# Patient Record
Sex: Male | Born: 1975 | Race: White | Hispanic: No | Marital: Married | Smoking: Former smoker
Health system: Southern US, Community
[De-identification: ages and names within clinical notes are randomized; demographics above are authoritative.]

## PROBLEM LIST (undated history)

## (undated) DIAGNOSIS — R002 Palpitations: Secondary | ICD-10-CM

## (undated) DIAGNOSIS — G4700 Insomnia, unspecified: Secondary | ICD-10-CM

## (undated) DIAGNOSIS — K760 Fatty (change of) liver, not elsewhere classified: Secondary | ICD-10-CM

## (undated) DIAGNOSIS — R079 Chest pain, unspecified: Secondary | ICD-10-CM

## (undated) DIAGNOSIS — I700 Atherosclerosis of aorta: Secondary | ICD-10-CM

## (undated) DIAGNOSIS — I2510 Atherosclerotic heart disease of native coronary artery without angina pectoris: Secondary | ICD-10-CM

## (undated) DIAGNOSIS — F419 Anxiety disorder, unspecified: Secondary | ICD-10-CM

## (undated) DIAGNOSIS — E538 Deficiency of other specified B group vitamins: Secondary | ICD-10-CM

## (undated) DIAGNOSIS — R7989 Other specified abnormal findings of blood chemistry: Secondary | ICD-10-CM

## (undated) DIAGNOSIS — K219 Gastro-esophageal reflux disease without esophagitis: Secondary | ICD-10-CM

## (undated) DIAGNOSIS — T7840XA Allergy, unspecified, initial encounter: Secondary | ICD-10-CM

## (undated) DIAGNOSIS — Z9289 Personal history of other medical treatment: Secondary | ICD-10-CM

## (undated) DIAGNOSIS — G4730 Sleep apnea, unspecified: Secondary | ICD-10-CM

## (undated) DIAGNOSIS — E559 Vitamin D deficiency, unspecified: Secondary | ICD-10-CM

## (undated) HISTORY — DX: Atherosclerotic heart disease of native coronary artery without angina pectoris: I25.10

## (undated) HISTORY — PX: APPENDECTOMY: SHX54

## (undated) HISTORY — DX: Morbid (severe) obesity due to excess calories: E66.01

## (undated) HISTORY — DX: Insomnia, unspecified: G47.00

## (undated) HISTORY — DX: Anxiety disorder, unspecified: F41.9

## (undated) HISTORY — PX: SURGERY SCROTAL / TESTICULAR: SUR1316

## (undated) HISTORY — DX: Vitamin D deficiency, unspecified: E55.9

## (undated) HISTORY — DX: Gastro-esophageal reflux disease without esophagitis: K21.9

## (undated) HISTORY — DX: Palpitations: R00.2

## (undated) HISTORY — DX: Deficiency of other specified B group vitamins: E53.8

## (undated) HISTORY — DX: Fatty (change of) liver, not elsewhere classified: K76.0

## (undated) HISTORY — DX: Other specified abnormal findings of blood chemistry: R79.89

## (undated) HISTORY — DX: Personal history of other medical treatment: Z92.89

## (undated) HISTORY — DX: Atherosclerosis of aorta: I70.0

## (undated) HISTORY — DX: Chest pain, unspecified: R07.9

## (undated) HISTORY — DX: Allergy, unspecified, initial encounter: T78.40XA

## (undated) HISTORY — PX: TONSILLECTOMY: SUR1361

---

## 2005-09-29 ENCOUNTER — Emergency Department: Payer: Self-pay | Admitting: Emergency Medicine

## 2013-01-11 ENCOUNTER — Emergency Department: Payer: Self-pay | Admitting: Emergency Medicine

## 2013-01-11 LAB — COMPREHENSIVE METABOLIC PANEL
Albumin: 3.8 g/dL (ref 3.4–5.0)
Alkaline Phosphatase: 64 U/L (ref 50–136)
Anion Gap: 6 — ABNORMAL LOW (ref 7–16)
BUN: 10 mg/dL (ref 7–18)
Bilirubin,Total: 0.3 mg/dL (ref 0.2–1.0)
Chloride: 111 mmol/L — ABNORMAL HIGH (ref 98–107)
Creatinine: 1 mg/dL (ref 0.60–1.30)
EGFR (African American): >60
EGFR (Non-African Amer.): >60
Potassium: 4 mmol/L (ref 3.5–5.1)
SGOT(AST): 58 U/L — ABNORMAL HIGH (ref 15–37)
SGPT (ALT): 91 U/L — ABNORMAL HIGH (ref 12–78)

## 2013-01-11 LAB — CBC
HGB: 14.5 g/dL (ref 13.0–18.0)
MCHC: 33.8 g/dL (ref 32.0–36.0)
MCV: 89 fL (ref 80–100)
Platelet: 188 10*3/uL (ref 150–440)
RBC: 4.81 10*6/uL (ref 4.40–5.90)
RDW: 13.1 % (ref 11.5–14.5)

## 2013-01-11 LAB — APTT: Activated PTT: 35.4 secs (ref 23.6–35.9)

## 2013-01-11 LAB — TROPONIN I
Troponin-I: <0.02 ng/mL
Troponin-I: <0.02 ng/mL

## 2013-01-11 LAB — CK TOTAL AND CKMB (NOT AT ARMC): CK, Total: 110 U/L (ref 35–232)

## 2013-01-14 ENCOUNTER — Encounter: Payer: Self-pay | Admitting: Cardiovascular Disease

## 2013-02-13 ENCOUNTER — Ambulatory Visit: Payer: Self-pay | Admitting: Unknown Physician Specialty

## 2014-07-25 ENCOUNTER — Ambulatory Visit: Payer: Self-pay | Admitting: Unknown Physician Specialty

## 2014-08-18 DIAGNOSIS — K219 Gastro-esophageal reflux disease without esophagitis: Secondary | ICD-10-CM | POA: Insufficient documentation

## 2014-10-09 ENCOUNTER — Ambulatory Visit: Payer: Self-pay | Admitting: Family Medicine

## 2015-05-05 ENCOUNTER — Other Ambulatory Visit
Admission: RE | Admit: 2015-05-05 | Discharge: 2015-05-05 | Disposition: A | Discharge status: Home / Self Care | Payer: Self-pay | Attending: Nurse Practitioner | Admitting: Nurse Practitioner

## 2015-05-05 DIAGNOSIS — R197 Diarrhea, unspecified: Secondary | ICD-10-CM | POA: Insufficient documentation

## 2015-05-05 LAB — C DIFFICILE QUICK SCREEN W PCR REFLEX
C Diff antigen: NEGATIVE
C Diff interpretation: NEGATIVE
C Diff toxin: NEGATIVE

## 2015-08-20 ENCOUNTER — Ambulatory Visit: Payer: Self-pay | Admitting: Family Medicine

## 2015-08-25 ENCOUNTER — Encounter: Payer: Self-pay | Admitting: Family Medicine

## 2015-08-25 ENCOUNTER — Ambulatory Visit (INDEPENDENT_AMBULATORY_CARE_PROVIDER_SITE_OTHER): Payer: PRIVATE HEALTH INSURANCE | Admitting: Family Medicine

## 2015-08-25 VITALS — BP 134/72 | HR 88 | Temp 98.5°F | Resp 19 | Ht 77.0 in | Wt 303.6 lb

## 2015-08-25 DIAGNOSIS — R5383 Other fatigue: Secondary | ICD-10-CM | POA: Insufficient documentation

## 2015-08-25 DIAGNOSIS — E291 Testicular hypofunction: Secondary | ICD-10-CM | POA: Diagnosis not present

## 2015-08-25 DIAGNOSIS — R0600 Dyspnea, unspecified: Secondary | ICD-10-CM

## 2015-08-25 DIAGNOSIS — R7989 Other specified abnormal findings of blood chemistry: Secondary | ICD-10-CM | POA: Insufficient documentation

## 2015-08-25 MED ORDER — FLUTICASONE PROPIONATE 50 MCG/ACT NA SUSP: 2.0000 | Freq: Every day | NASAL | Status: DC

## 2015-08-25 NOTE — Progress Notes (Signed)
Name: Chris Baldwin   MRN: [id number]    DOB: [birth year]   Date:08/25/2015       Progress Note  Subjective  Chief Complaint  Chief Complaint  Patient presents with  . Establish Care    NP (Dr. Miles Costain)   . Medication Refill  . Shortness of Breath    Patient states he is having breathing issues  . Gastroesophageal Reflux    Shortness of Breath This is a new problem. Episode onset: 3-4 weeks. Pertinent negatives include no chest pain, ear pain, fever, hemoptysis, leg pain, sore throat, sputum production or wheezing. Associated symptoms comments: Difficult to take a deep breath and pain inside his neck area in the throat.Marland Kitchen He has tried nothing for the symptoms. There is no history of CAD, chronic lung disease or pneumonia.    Past Medical History  Diagnosis Date  . GERD (gastroesophageal reflux disease)     Past Surgical History  Procedure Laterality Date  . Tonsillectomy Bilateral     in childhood  . Appendectomy      in young adult years  . Surgery scrotal / testicular      young adult years    Family History  Problem Relation Age of Onset  . Heart disease Mother   . Diabetes Mother   . Cancer Mother     colon  . Arthritis Mother   . Heart disease Father   . Arthritis Father     Social History   Social History  . Marital Status: Single    Spouse Name: N/A  . Number of Children: N/A  . Years of Education: N/A   Occupational History  . Not on file.   Social History Main Topics  . Smoking status: Former Smoker -- 1 years    Types: Cigarettes  . Smokeless tobacco: Never Used  . Alcohol Use: 0.0 - 0.6 oz/week    0 Standard drinks or equivalent, 0-1 Glasses of wine per week     Comment: occasional  . Drug Use: No  . Sexual Activity:    Partners: Female   Other Topics Concern  . Not on file   Social History Narrative  . No narrative on file    Current outpatient prescriptions:  .  esomeprazole (NEXIUM) 40 MG capsule, Take by mouth., Disp: ,  Rfl:  .  fluticasone (FLONASE ALLERGY RELIEF) 50 MCG/ACT nasal spray, Place into the nose., Disp: , Rfl:   No Known Allergies   Review of Systems  Constitutional: Positive for malaise/fatigue. Negative for fever, chills and weight loss.  HENT: Positive for congestion (nasal congestion). Negative for ear pain and sore throat.   Respiratory: Positive for shortness of breath. Negative for cough, hemoptysis, sputum production, wheezing and stridor.   Cardiovascular: Negative for chest pain.   Objective  Filed Vitals:   08/25/15 1348  BP: 138/70  Pulse: 84  Temp: 98.5 F (36.9 C)  TempSrc: Oral  Resp: 19  Height: 6\' 5"  (1.956 m)  Weight: 303 lb 9.6 oz (137.712 kg)  SpO2: 98%    Physical Exam  Constitutional: He is well-developed, well-nourished, and in no distress.  HENT:  Head: Normocephalic and atraumatic.  Right Ear: Tympanic membrane and ear canal normal.  Left Ear: Tympanic membrane and ear canal normal.  Nose: Mucosal edema present.  Mouth/Throat: Posterior oropharyngeal erythema present. No oropharyngeal exudate.  Nasal turbinate hypertrophy and congestion. Oropharyngeal inflammation and drainage   Neck: Neck supple.  Cardiovascular: Normal rate, regular rhythm and normal  heart sounds.   No murmur heard. Pulmonary/Chest: Effort normal and breath sounds normal. No respiratory distress. He has no wheezes. He has no rales.  Nursing note and vitals reviewed.   Assessment & Plan  1. Low testosterone In the past, never started on testosterone replacement therapy. Recheck levels and follow-up. - Testosterone  2. Dyspnea Referral to pulmonology for pulmonary function testing. Symptoms likely from allergies. - CBC with Differential - fluticasone (FLONASE ALLERGY RELIEF) 50 MCG/ACT nasal spray; Place 2 sprays into both nostrils daily.  Dispense: 16 g; Refill: 2 - Ambulatory referral to Pulmonology   Chris Baldwin [facility] 08/25/2015 2:15 PM

## 2015-08-28 LAB — CBC WITH DIFFERENTIAL/PLATELET
Basophils Absolute: 0 10*3/uL (ref 0.0–0.2)
Basos: 0 %
EOS (ABSOLUTE): 0.2 10*3/uL (ref 0.0–0.4)
Eos: 3 %
HEMOGLOBIN: 14.8 g/dL (ref 12.6–17.7)
Hematocrit: 43.6 % (ref 37.5–51.0)
IMMATURE GRANULOCYTES: 1 %
Immature Grans (Abs): 0 10*3/uL (ref 0.0–0.1)
LYMPHS: 35 %
Lymphocytes Absolute: 2.4 10*3/uL (ref 0.7–3.1)
MCH: 30.1 pg (ref 26.6–33.0)
MCHC: 33.9 g/dL (ref 31.5–35.7)
MCV: 89 fL (ref 79–97)
MONOCYTES: 6 %
Monocytes Absolute: 0.4 10*3/uL (ref 0.1–0.9)
NEUTROS ABS: 3.8 10*3/uL (ref 1.4–7.0)
Neutrophils: 55 %
Platelets: 195 10*3/uL (ref 150–379)
RBC: 4.91 x10E6/uL (ref 4.14–5.80)
RDW: 13.7 % (ref 12.3–15.4)
WBC: 6.9 10*3/uL (ref 3.4–10.8)

## 2015-08-28 LAB — TESTOSTERONE: Testosterone: 234 ng/dL — ABNORMAL LOW (ref 348–1197)

## 2015-09-08 ENCOUNTER — Ambulatory Visit (INDEPENDENT_AMBULATORY_CARE_PROVIDER_SITE_OTHER): Payer: PRIVATE HEALTH INSURANCE | Admitting: Family Medicine

## 2015-09-08 ENCOUNTER — Encounter: Payer: Self-pay | Admitting: Family Medicine

## 2015-09-08 VITALS — BP 122/72 | HR 79 | Temp 98.8°F | Resp 18 | Ht 77.0 in | Wt 299.8 lb

## 2015-09-08 DIAGNOSIS — R7989 Other specified abnormal findings of blood chemistry: Secondary | ICD-10-CM

## 2015-09-08 DIAGNOSIS — E291 Testicular hypofunction: Secondary | ICD-10-CM

## 2015-09-08 MED ORDER — TESTOSTERONE 20.25 MG/ACT (1.62%) TD GEL: TRANSDERMAL | Status: DC

## 2015-09-08 NOTE — Progress Notes (Signed)
Name: Chris Baldwin   MRN: [id number]    DOB: 11-22-1975   Date:09/08/2015       Progress Note  Subjective  Chief Complaint  Chief Complaint  Patient presents with  . Abnormal lab results    Testosterone low    HPI  Pt. Is here for discussion of Testosterone Replacement Therapy. His last Testosterone level was 234. Pt. Feels fatigues, no energy. Has never been on testosterone supplementation in the past. No known history of prostate disease/enlargement. He does have history of elevated liver enzymes (questionable fatty liver disease).   Past Medical History  Diagnosis Date  . GERD (gastroesophageal reflux disease)   . Allergy     Past Surgical History  Procedure Laterality Date  . Tonsillectomy Bilateral     in childhood  . Appendectomy      in young adult years  . Surgery scrotal / testicular      young adult years    Family History  Problem Relation Age of Onset  . Heart disease Mother   . Diabetes Mother   . Cancer Mother     colon  . Arthritis Mother   . Heart disease Father   . Arthritis Father     Social History   Social History  . Marital Status: Single    Spouse Name: N/A  . Number of Children: N/A  . Years of Education: N/A   Occupational History  . Not on file.   Social History Main Topics  . Smoking status: Current Some Day Smoker -- 1 years    Types: Cigarettes  . Smokeless tobacco: Never Used  . Alcohol Use: 0.0 - 0.6 oz/week    0 Standard drinks or equivalent, 0-1 Glasses of wine per week     Comment: occasional  . Drug Use: No  . Sexual Activity:    Partners: Female   Other Topics Concern  . Not on file   Social History Narrative    Current outpatient prescriptions:  .  esomeprazole (NEXIUM) 40 MG capsule, Take by mouth., Disp: , Rfl:  .  fluticasone (FLONASE ALLERGY RELIEF) 50 MCG/ACT nasal spray, Place 2 sprays into both nostrils daily., Disp: 16 g, Rfl: 2  No Known Allergies   Review of Systems  Constitutional:  Positive for malaise/fatigue. Negative for weight loss.  Gastrointestinal: Negative for nausea, vomiting and abdominal pain (intermittent abdominal pain).  Genitourinary: Negative for dysuria, frequency and hematuria.    Objective  Filed Vitals:   09/08/15 1529  BP: 122/72  Pulse: 79  Temp: 98.8 F (37.1 C)  TempSrc: Oral  Resp: 18  Height: 6\' 5"  (1.956 m)  Weight: 299 lb 12.8 oz (135.988 kg)  SpO2: 97%    Physical Exam  Constitutional: He is oriented to person, place, and time and well-developed, well-nourished, and in no distress.  Cardiovascular: Normal rate, regular rhythm and normal heart sounds.   No murmur heard. Pulmonary/Chest: Effort normal and breath sounds normal. He has no wheezes.  Abdominal: Soft. Bowel sounds are normal. There is no tenderness.  Neurological: He is alert and oriented to person, place, and time.  Psychiatric: Memory, affect and judgment normal.  Nursing note and vitals reviewed.   Recent Results (from the past 2160 hour(s))  CBC with Differential     Status: None   Collection Time: 08/27/15  8:22 AM  Result Value Ref Range   WBC 6.9 3.4 - 10.8 x10E3/uL   RBC 4.91 4.14 - 5.80 x10E6/uL   Hemoglobin  14.8 12.6 - 17.7 g/dL   Hematocrit 43.6 37.5 - 51.0 %   MCV 89 79 - 97 fL   MCH 30.1 26.6 - 33.0 pg   MCHC 33.9 31.5 - 35.7 g/dL   RDW 13.7 12.3 - 15.4 %   Platelets 195 150 - 379 x10E3/uL   Neutrophils 55 %   Lymphs 35 %   Monocytes 6 %   Eos 3 %   Basos 0 %   Neutrophils Absolute 3.8 1.4 - 7.0 x10E3/uL   Lymphocytes Absolute 2.4 0.7 - 3.1 x10E3/uL   Monocytes Absolute 0.4 0.1 - 0.9 x10E3/uL   EOS (ABSOLUTE) 0.2 0.0 - 0.4 x10E3/uL   Basophils Absolute 0.0 0.0 - 0.2 x10E3/uL   Immature Granulocytes 1 %   Immature Grans (Abs) 0.0 0.0 - 0.1 x10E3/uL  Testosterone     Status: Abnormal   Collection Time: 08/27/15  8:22 AM  Result Value Ref Range   Testosterone 234 (L) 348 - 1197 ng/dL   Comment, Testosterone Comment     Comment: Adult  male reference interval is based on a population of lean males up to 39 years old.    Assessment & Plan  1. Low testosterone After discussion of risks versus benefits of testosterone replacement therapy. We will start on Andro gel daily. Will obtain PSA and liver enzymes prior to initiation of therapy. Recheck levels in 6 weeks. - Testosterone (ANDROGEL PUMP) 20.25 MG/ACT (1.62%) GEL; Apply 40.5 mg qAM to shoulders in AM (20.25 mg to each shoulder).  Dispense: 150 g; Refill: 0 - PSA - Hepatic function panel  Reginald Weida [facility] 09/08/2015 3:39 PM

## 2015-09-09 LAB — PSA: Prostate Specific Ag, Serum: 0.7 ng/mL (ref 0.0–4.0)

## 2015-09-09 LAB — HEPATIC FUNCTION PANEL
ALBUMIN: 4.7 g/dL (ref 3.5–5.5)
ALT: 24 IU/L (ref 0–44)
AST: 21 IU/L (ref 0–40)
Alkaline Phosphatase: 56 IU/L (ref 39–117)
Bilirubin Total: 0.2 mg/dL (ref 0.0–1.2)
Bilirubin, Direct: 0.08 mg/dL (ref 0.00–0.40)
TOTAL PROTEIN: 6.8 g/dL (ref 6.0–8.5)

## 2015-09-11 ENCOUNTER — Other Ambulatory Visit: Payer: Self-pay | Admitting: Family Medicine

## 2015-09-11 ENCOUNTER — Telehealth: Payer: Self-pay | Admitting: Family Medicine

## 2015-09-11 NOTE — Telephone Encounter (Signed)
Routed to Dr. Shah to change prescription  °

## 2015-09-11 NOTE — Telephone Encounter (Signed)
Went to the pharmacy and the Testosterone will cost him almost $1,000. Requesting that the generic form be called in to [place].

## 2015-09-11 NOTE — Telephone Encounter (Signed)
Spoke with pharmacist at the patient's pharmacy and the AndroGel packets 50 mg each will be less expensive than the AndroGel pump. She will send a request to [place] for prior authorization for AndroGel packets.

## 2015-09-14 ENCOUNTER — Telehealth: Payer: Self-pay

## 2015-09-14 NOTE — Telephone Encounter (Signed)
Patient called wanting to know about the prior auth for the Andro Gel. After consulting with [facility] and reviewing Dr. Trena Platt note, patient was informed that his insurance company was requiring a 2nd Testosterone test proving that the results were not within range in order to cover the AndroGel pump. He was informed that we have not received anything regarding the AndroGel packets. He was then encouraged to call his pharmacy to see if the new rx for the AndroGel packets required a prior authorization and if so an order to check his Testosterone levels will be submitted to the lab. Patient said thanks and that he will give them a call.

## 2015-09-30 ENCOUNTER — Telehealth: Payer: Self-pay

## 2015-09-30 DIAGNOSIS — R7989 Other specified abnormal findings of blood chemistry: Secondary | ICD-10-CM

## 2015-09-30 NOTE — Telephone Encounter (Signed)
Order placed for testosterone lab.  

## 2015-09-30 NOTE — Telephone Encounter (Signed)
Routed to Dr. Shah to order labs °

## 2015-10-08 ENCOUNTER — Encounter (INDEPENDENT_AMBULATORY_CARE_PROVIDER_SITE_OTHER): Payer: Self-pay

## 2015-10-09 LAB — TESTOSTERONE: Testosterone: 228 ng/dL — ABNORMAL LOW (ref 348–1197)

## 2015-10-13 ENCOUNTER — Institutional Professional Consult (permissible substitution): Payer: 59 | Admitting: Pulmonary Disease

## 2015-10-19 ENCOUNTER — Encounter: Payer: Self-pay | Admitting: Pulmonary Disease

## 2015-10-19 ENCOUNTER — Ambulatory Visit (INDEPENDENT_AMBULATORY_CARE_PROVIDER_SITE_OTHER): Payer: No Typology Code available for payment source | Admitting: Pulmonary Disease

## 2015-10-19 ENCOUNTER — Ambulatory Visit (INDEPENDENT_AMBULATORY_CARE_PROVIDER_SITE_OTHER)
Admission: RE | Admit: 2015-10-19 | Discharge: 2015-10-19 | Disposition: A | Discharge status: Home / Self Care | Payer: No Typology Code available for payment source | Source: Ambulatory Visit | Attending: Pulmonary Disease | Admitting: Pulmonary Disease

## 2015-10-19 VITALS — BP 140/90 | HR 75 | Ht 76.0 in | Wt 301.8 lb

## 2015-10-19 DIAGNOSIS — R0689 Other abnormalities of breathing: Secondary | ICD-10-CM

## 2015-10-19 DIAGNOSIS — R0600 Dyspnea, unspecified: Secondary | ICD-10-CM

## 2015-10-19 NOTE — Patient Instructions (Signed)
We scheduled a for a chest x-ray, lung function tests, sleep study to evaluate apnea.  Return to clinic in 3 months.

## 2015-10-19 NOTE — Progress Notes (Signed)
   Subjective:    Patient ID: Chris Baldwin, male    DOB: 10/11/1976, 39 y.o.   MRN: [id number]  HPI Referral for evaluation of dyspnea  Mr. Kaemmerer is a 39 year old with history of GERD, low testosterone. Complains of dyspnea for the past year. It occurs on lying down associated with occasional wheezing. He also has dyspnea on exertion. This is associated with the cough sometimes, no sputum production no fevers or chills. Does not have any symptoms of rhinitis, postnasal drip. He does have history of GERD which is treated on occasion with over-the-counter medications. He also has complaints of heavy snoring at night, daytime sleepiness.  Social history: He is a heavy ex-smoker. He smoked 2 packs per day for 15 years. He has cut down 2 years ago and is now smoking about 1-2 cigarettes a month. He works as a Loss adjuster, chartered and used to work at [employer] in the past. He has considerable exposure to asbestos in his line of work. Drinks alcohol 1-2 times monthly. No illegal drug use  Family history: Dad-COPD, coronary artery disease, thoracic aneurysm. Mom-colon cancer  Past Medical History  Diagnosis Date  . GERD (gastroesophageal reflux disease)   . Allergy     Current outpatient prescriptions:  .  esomeprazole (NEXIUM) 40 MG capsule, Take by mouth., Disp: , Rfl:  .  fluticasone (FLONASE ALLERGY RELIEF) 50 MCG/ACT nasal spray, Place 2 sprays into both nostrils daily., Disp: 16 g, Rfl: 2   Review of Systems Dyspnea, occasional wheeze with cough. No sputum production, hemoptysis. No fevers, chills, loss of weight, appetite place, fatigue. No chest pain, palpitations. No nausea, vomiting, diarrhea, constipation. All other review of systems are negative.    Objective:   Physical Exam  Blood pressure 140/90, pulse 75, height 6\' 4"  (1.93 m), weight 301 lb 12.8 oz (136.896 kg), SpO2 97 %.  Gen: No apparent distress Neuro: No gross focal deficits. Neck: No JVD, lymphadenopathy,  thyromegaly. RS: Clear, no wheeze, crackles. CVS: S1-S2 heard, no murmurs rubs gallops. Abdomen: Soft, positive bowel sounds. Extremities: No edema.    Assessment & Plan:  Dyspnea. This could be secondary to COPD from heavy smoking. He also has exposure to asbestos. He needs to be evaluated for interstitial lung disease such as asbestosis. I will start by getting lung function tests and a chest x-ray. If abnormal then we can consider getting a CT of the chest for further evaluation. He may benefit from a Spiriva inhaler but will hold off for now until we get the results of these tests.  Symptoms of OSA. I will schedule him for a sleep  Plan: - Pulmonary function tests - Chest x-ray - Sleep study.  He declined to get a flu vaccine today. Return to clinic in 3 months.  Marshell Garfinkel MD [facility] Pager [phone] If no answer or after 3pm call: [phone] 10/19/2015, 5:03 PM

## 2015-10-22 ENCOUNTER — Telehealth: Payer: Self-pay | Admitting: Pulmonary Disease

## 2015-10-22 NOTE — Telephone Encounter (Signed)
Result Notes     Notes Recorded by Inge Rise, CMA on 10/22/2015 at 10:15 AM LMTCB x1 ------  Notes Recorded by Marshell Garfinkel, MD on 10/21/2015 at 5:00 PM Please let the patient know that CXR does not show any emphysema or asbestosis. There is some mild bronchitis that can be followed up later at his repeat visit.     Spoke with pt. He is aware of results. Nothing further was needed.

## 2015-10-28 ENCOUNTER — Encounter: Payer: PRIVATE HEALTH INSURANCE | Admitting: Family Medicine

## 2015-11-10 ENCOUNTER — Encounter: Payer: PRIVATE HEALTH INSURANCE | Admitting: Family Medicine

## 2015-11-13 ENCOUNTER — Telehealth: Payer: Self-pay | Admitting: Family Medicine

## 2015-11-13 NOTE — Telephone Encounter (Signed)
Routed to Dr. Shah for advice  

## 2015-11-13 NOTE — Telephone Encounter (Signed)
Patient is wanting to know the status of his testosterone medication.  Patient stated that he has been waiting for 2 months.

## 2015-11-14 NOTE — Telephone Encounter (Signed)
Patient was started on AndroGel for low testosterone but was apparently denied coverage by insurance company. Please schedule patient for an appointment to discuss a different testosterone formulation. In addition, he may also contact the insurance company to find out the list of formulations which are covered.

## 2015-11-24 NOTE — Telephone Encounter (Signed)
Pt called and would like a call back as soon as possible. I explain the below message to him and he needs to speak to the nurse or Dr Manuella Ghazi.

## 2015-12-09 ENCOUNTER — Encounter: Payer: Self-pay | Admitting: Family Medicine

## 2015-12-09 ENCOUNTER — Ambulatory Visit (INDEPENDENT_AMBULATORY_CARE_PROVIDER_SITE_OTHER): Payer: PRIVATE HEALTH INSURANCE | Admitting: Family Medicine

## 2015-12-09 VITALS — BP 139/74 | HR 76 | Temp 98.2°F | Resp 17 | Ht 76.0 in | Wt 298.9 lb

## 2015-12-09 DIAGNOSIS — E291 Testicular hypofunction: Secondary | ICD-10-CM | POA: Diagnosis not present

## 2015-12-09 DIAGNOSIS — R7989 Other specified abnormal findings of blood chemistry: Secondary | ICD-10-CM

## 2015-12-09 MED ORDER — ANDROGEL 20.25 MG/1.25GM (1.62%) TD GEL: 40.5000 mg | Freq: Every morning | TRANSDERMAL | Status: DC

## 2015-12-09 NOTE — Progress Notes (Signed)
Name: Chris Baldwin   MRN: [id number]    DOB: 29-Jun-1976   Date:12/09/2015       Progress Note  Subjective  Chief Complaint  Chief Complaint  Patient presents with  . Annual Exam    CPE    HPI  Pt. Is here for Testosterone replacement therapy. His testosterone was low at 234 in October 2016. He was initially prescribed Androgel 1.62%, which was denied because he had only one low testosterone reading. Second reading obtained in December was 228. He will be started on Androgel once again.  Past Medical History  Diagnosis Date  . GERD (gastroesophageal reflux disease)   . Allergy     Past Surgical History  Procedure Laterality Date  . Tonsillectomy Bilateral     in childhood  . Appendectomy      in young adult years  . Surgery scrotal / testicular      young adult years    Family History  Problem Relation Age of Onset  . Heart disease Mother   . Diabetes Mother   . Cancer Mother     colon  . Arthritis Mother   . Heart disease Father   . Arthritis Father     Social History   Social History  . Marital Status: Single    Spouse Name: N/A  . Number of Children: N/A  . Years of Education: N/A   Occupational History  . Not on file.   Social History Main Topics  . Smoking status: Current Some Day Smoker -- 1 years    Types: Cigarettes  . Smokeless tobacco: Never Used  . Alcohol Use: 0.0 - 0.6 oz/week    0 Standard drinks or equivalent, 0-1 Glasses of wine per week     Comment: occasional  . Drug Use: No  . Sexual Activity:    Partners: Female   Other Topics Concern  . Not on file   Social History Narrative     Current outpatient prescriptions:  .  fluticasone (FLONASE ALLERGY RELIEF) 50 MCG/ACT nasal spray, Place 2 sprays into both nostrils daily., Disp: 16 g, Rfl: 2 .  esomeprazole (NEXIUM) 40 MG capsule, Take by mouth. Reported on 12/09/2015, Disp: , Rfl:   No Known Allergies   ROS    Objective  Filed Vitals:   12/09/15 0821  BP: 139/74   Pulse: 76  Temp: 98.2 F (36.8 C)  TempSrc: Oral  Resp: 17  Height: 6\' 4"  (1.93 m)  Weight: 298 lb 14.4 oz (135.58 kg)  SpO2: 97%    Physical Exam  Constitutional: He is oriented to person, place, and time and well-developed, well-nourished, and in no distress.  HENT:  Head: Normocephalic and atraumatic.  Cardiovascular: Normal rate and regular rhythm.   Pulmonary/Chest: Effort normal and breath sounds normal.  Abdominal: Soft. Bowel sounds are normal.  Neurological: He is alert and oriented to person, place, and time.  Nursing note and vitals reviewed.      Recent Results (from the past 2160 hour(s))  Testosterone     Status: Abnormal   Collection Time: 10/08/15  8:24 AM  Result Value Ref Range   Testosterone 228 (L) 348 - 1197 ng/dL   Comment, Testosterone Comment     Comment: Adult male reference interval is based on a population of lean males up to 40 years old.      Assessment & Plan  1. Low testosterone Started on AndroGel 1.62%, reviewed testosterone and other labs. Recheck levels in 6  weeks. - ANDROGEL 20.25 MG/1.25GM (1.62%) GEL; Place 40.5 mg onto the skin every morning.  Dispense: 1.25 g; Refill: 0   Pryce Folts [facility] 12/09/2015 8:26 AM

## 2015-12-17 ENCOUNTER — Telehealth: Payer: Self-pay

## 2015-12-17 NOTE — Telephone Encounter (Signed)
Spoke with patient and notified him that I spoke with [facility] and Androderm 1.62% or Testosterone Cypionate 1.62% is on his formulary. I have sent a message to Dr. Manuella Ghazi to change it and place the denial letter on his desk. I will call patient once New Rx has been sent to his pharmacy. Patient verbalized understanding

## 2015-12-18 ENCOUNTER — Other Ambulatory Visit: Payer: Self-pay | Admitting: Family Medicine

## 2015-12-20 ENCOUNTER — Ambulatory Visit (HOSPITAL_BASED_OUTPATIENT_CLINIC_OR_DEPARTMENT_OTHER): Payer: No Typology Code available for payment source | Attending: Pulmonary Disease

## 2015-12-20 VITALS — Ht 77.0 in | Wt 298.0 lb

## 2015-12-20 DIAGNOSIS — R0683 Snoring: Secondary | ICD-10-CM | POA: Diagnosis present

## 2015-12-20 DIAGNOSIS — G4710 Hypersomnia, unspecified: Secondary | ICD-10-CM | POA: Diagnosis not present

## 2015-12-20 DIAGNOSIS — R0600 Dyspnea, unspecified: Secondary | ICD-10-CM | POA: Diagnosis not present

## 2015-12-20 DIAGNOSIS — G4733 Obstructive sleep apnea (adult) (pediatric): Secondary | ICD-10-CM | POA: Insufficient documentation

## 2015-12-20 DIAGNOSIS — R0689 Other abnormalities of breathing: Secondary | ICD-10-CM

## 2015-12-21 DIAGNOSIS — G4733 Obstructive sleep apnea (adult) (pediatric): Secondary | ICD-10-CM | POA: Diagnosis not present

## 2015-12-21 NOTE — Progress Notes (Signed)
Patient Name: Chris Baldwin, [place] Date: 12/20/2015 Gender: Male D.O.B: [birth year] Age (years): 39 Referring Provider: Marshell Garfinkel Height (inches): 72 Interpreting Physician: Chesley Mires MD, ABSM Weight (lbs): 298 RPSGT: Madelon Lips BMI: 35 MRN: [id number] Neck Size: 18.50  CLINICAL INFORMATION Sleep Study Type: Split Night CPAP   Indication for sleep study: snoring, sleep disruption, and daytime sleepiness.   Epworth Sleepiness Score: 10  SLEEP STUDY TECHNIQUE As per the AASM Manual for the Scoring of Sleep and Associated Events v2.3 (April 2016) with a hypopnea requiring 4% desaturations. The channels recorded and monitored were frontal, central and occipital EEG, electrooculogram (EOG), submentalis EMG (chin), nasal and oral airflow, thoracic and abdominal wall motion, anterior tibialis EMG, snore microphone, electrocardiogram, and pulse oximetry. Continuous positive airway pressure (CPAP) was initiated when the patient met split night criteria and was titrated according to treat sleep-disordered breathing.  MEDICATIONS Medications taken by the patient : reviewed in electronic medical record. Medications administered by patient during sleep study : No sleep medicine administered.  RESPIRATORY PARAMETERS Diagnostic Total AHI (/hr): 31.4 RDI (/hr): 34.7 OA Index (/hr): - CA Index (/hr): 15.0 REM AHI (/hr): 2.9 NREM AHI (/hr): 45.2 Supine AHI (/hr): 106.7 Non-supine AHI (/hr): 27.25 Min O2 Sat (%): 85.00 Mean O2 (%): 92.81 Time below 88% (min): 2.1   Titration Optimal Pressure (cm): 11 AHI at Optimal Pressure (/hr): 18.2 Min O2 at Optimal Pressure (%): 87.0 Supine % at Optimal (%): 22 Sleep % at Optimal (%): 96    SLEEP ARCHITECTURE The recording time for the entire night was 399.2 minutes. During a baseline period of 160.4 minutes, the patient slept for 127.8 minutes in REM and nonREM, yielding a sleep efficiency of 79.7%. Sleep onset after lights out was 14.0 minutes with  a REM latency of 95.5 minutes. The patient spent 18.77% of the night in stage N1 sleep, 47.59% in stage N2 sleep, 1.17% in stage N3 and 32.46% in REM. During the titration period of 230.2 minutes, the patient slept for 216.1 minutes in REM and nonREM, yielding a sleep efficiency of 93.9%. Sleep onset after CPAP initiation was 2.1 minutes with a REM latency of 114.5 minutes. The patient spent 9.72% of the night in stage N1 sleep, 61.83% in stage N2 sleep, 1.85% in stage N3 and 26.60% in REM.  CARDIAC DATA The 2 lead EKG demonstrated sinus rhythm. The mean heart rate was 60.03 beats per minute. Other EKG findings include: None.  LEG MOVEMENT DATA The total Periodic Limb Movements of Sleep (PLMS) were 0. The PLMS index was 0.00 .  IMPRESSIONS This study showed severe obstructive sleep apnea with an AHI of 31.4 and SaO2 low of 85%.  He was titrated up to CPAP of 11 cm H2O.  He had improvement in his respiratory events with CPAP 11 cm H2O, but was not observed in REM sleep at this pressure setting.  DIAGNOSIS - Obstructive Sleep Apnea (327.23 [G47.33 ICD-10])  RECOMMENDATIONS In addition to weight loss, I would recommend that the patient be started on CPAP.  His sleep apnea was controlled during NREM sleep with CPAP 11 cm H2O, but he was not observed in REM sleep with this pressure setting.  Options at this time are 1) start CPAP 11 cm H2O and monitor for clinical response, or 2) arrange for Auto CPAP set up with pressure setting of 5 to 15 cm H2O.   Chesley Mires, MD, [place], American Board of Sleep Medicine 12/21/2015, 11:33 AM  NPI: [id number]

## 2015-12-23 ENCOUNTER — Other Ambulatory Visit: Payer: Self-pay | Admitting: Family Medicine

## 2015-12-23 DIAGNOSIS — R7989 Other specified abnormal findings of blood chemistry: Secondary | ICD-10-CM

## 2015-12-23 MED ORDER — TESTOSTERONE CYPIONATE 100 MG/ML IM SOLN: 100.0000 mg | INTRAMUSCULAR | Status: DC

## 2015-12-24 ENCOUNTER — Telehealth: Payer: Self-pay

## 2015-12-24 DIAGNOSIS — R7989 Other specified abnormal findings of blood chemistry: Secondary | ICD-10-CM

## 2015-12-24 MED ORDER — TESTOSTERONE CYPIONATE 100 MG/ML IM SOLN: 100.0000 mg | INTRAMUSCULAR | Status: DC

## 2015-12-24 NOTE — Telephone Encounter (Signed)
Reprinted prescription per Dr. Manuella Ghazi

## 2015-12-25 NOTE — Telephone Encounter (Signed)
Notified patient that his Testosterone Rx is ready for him to pickup. He stated his wife will pick it up. Advised him to have her bring her photo ID.

## 2016-01-18 ENCOUNTER — Ambulatory Visit (INDEPENDENT_AMBULATORY_CARE_PROVIDER_SITE_OTHER): Payer: No Typology Code available for payment source | Admitting: Pulmonary Disease

## 2016-01-18 ENCOUNTER — Encounter: Payer: Self-pay | Admitting: Pulmonary Disease

## 2016-01-18 VITALS — BP 126/84 | HR 68 | Ht 77.0 in | Wt 301.0 lb

## 2016-01-18 DIAGNOSIS — R0600 Dyspnea, unspecified: Secondary | ICD-10-CM | POA: Diagnosis not present

## 2016-01-18 DIAGNOSIS — R0689 Other abnormalities of breathing: Secondary | ICD-10-CM

## 2016-01-18 DIAGNOSIS — G4733 Obstructive sleep apnea (adult) (pediatric): Secondary | ICD-10-CM | POA: Diagnosis not present

## 2016-01-18 LAB — PULMONARY FUNCTION TEST
DL/VA % pred: 100 %
DL/VA: 5.06 ml/min/mmHg/L
DLCO COR % PRED: 82 %
DLCO COR: 34.43 ml/min/mmHg
DLCO UNC: 34.53 ml/min/mmHg
DLCO unc % pred: 82 %
FEF 25-75 Post: 5.93 L/sec
FEF 25-75 Pre: 6.1 L/sec
FEF2575-%Change-Post: -2 %
FEF2575-%PRED-PRE: 130 %
FEF2575-%Pred-Post: 126 %
FEV1-%Change-Post: 0 %
FEV1-%Pred-Post: 94 %
FEV1-%Pred-Pre: 94 %
FEV1-Post: 4.87 L
FEV1-Pre: 4.87 L
FEV1FVC-%Change-Post: 2 %
FEV1FVC-%Pred-Pre: 108 %
FEV6-%CHANGE-POST: -2 %
FEV6-%PRED-POST: 85 %
FEV6-%PRED-PRE: 87 %
FEV6-POST: 5.49 L
FEV6-Pre: 5.6 L
FEV6FVC-%PRED-POST: 102 %
FEV6FVC-%Pred-Pre: 102 %
FVC-%CHANGE-POST: -2 %
FVC-%Pred-Post: 83 %
FVC-%Pred-Pre: 85 %
FVC-POST: 5.49 L
FVC-Pre: 5.6 L
POST FEV6/FVC RATIO: 100 %
PRE FEV1/FVC RATIO: 87 %
Post FEV1/FVC ratio: 89 %
Pre FEV6/FVC Ratio: 100 %
RV % pred: 77 %
RV: 1.71 L
TLC % PRED: 87 %
TLC: 7.26 L

## 2016-01-18 NOTE — Progress Notes (Signed)
   Subjective:    Patient ID: Chris Baldwin, male    DOB: Mar 10, 1976, 40 y.o.   MRN: [id number]  HPI Follow up for eval of dyspnea and sleep apnea.   Chris Baldwin is a 40 year old with history of GERD, low testosterone. Complains of dyspnea for the past year. It occurs on lying down associated with occasional wheezing. He also has dyspnea on exertion. This is associated with the cough sometimes, no sputum production no fevers or chills. Does not have any symptoms of rhinitis, postnasal drip. He does have history of GERD which is treated on occasion with over-the-counter medications. He also has complaints of heavy snoring at night, daytime sleepiness.  DATA: CXR 10/19/15 Possible mild bronchitis or reactive airways disease but no infiltrates or effusions.  Sleep study 12/21/15 Severe sleep apnea. AHI 31.4, SaO2 85%  PFTs 02/04/16 FVC 5.60[85%] FEV1 4.87 [94%] F/F 87 TLC 87% DLCO 82% Normal lung function.  Social history: He is a heavy ex-smoker. He smoked 2 packs per day for 15 years. He has cut down 2 years ago and is now smoking about 1-2 cigarettes a month. He works as a Loss adjuster, chartered and used to work at [employer] in the past. He has considerable exposure to asbestos in his line of work. Drinks alcohol 1-2 times monthly. No illegal drug use  Family history: Dad-COPD, coronary artery disease, thoracic aneurysm. Mom-colon cancer  Past Medical History  Diagnosis Date  . GERD (gastroesophageal reflux disease)   . Allergy     Current outpatient prescriptions:  .  esomeprazole (NEXIUM) 40 MG capsule, Take by mouth. Reported on 12/09/2015, Disp: , Rfl:  .  fluticasone (FLONASE ALLERGY RELIEF) 50 MCG/ACT nasal spray, Place 2 sprays into both nostrils daily., Disp: 16 g, Rfl: 2 .  testosterone cypionate (DEPOTESTOTERONE CYPIONATE) 100 MG/ML injection, Inject 1 mL (100 mg total) into the muscle every 14 (fourteen) days. For IM use only, Disp: 10 mL, Rfl: 0   Review of  Systems Dyspnea, occasional wheeze with cough. No sputum production, hemoptysis. No fevers, chills, loss of weight, appetite place, fatigue. No chest pain, palpitations. No nausea, vomiting, diarrhea, constipation. All other review of systems are negative.    Objective:   Physical Exam Blood pressure 126/84, pulse 68, height 6\' 5"  (1.956 m), weight 301 lb (136.533 kg), SpO2 97 %. Gen: No apparent distress Neuro: No gross focal deficits. Neck: No JVD, lymphadenopathy, thyromegaly. RS: Clear, no wheeze, crackles. CVS: S1-S2 heard, no murmurs rubs gallops. Abdomen: Soft, positive bowel sounds. Extremities: No edema.    Assessment & Plan:  #1 Dyspnea. Although he has a heavy smoking history there is no obstruction on PFTs. He also has exposure to asbestos but no evidence of interstitial lung disease such as asbestosis. I suspect his dyspnea is secondary to deconditioning, obesity. His symptoms are improving after he has started an exercise regimen and has cut down on smoking.  #2 Severe OSA. Start auto CPAP 5-15. Return to clinic in 3 months to assess response to therapy and check CPAP download.  Plan: - Advised exercise and weight loss - Start auto CPAP.  Return to clinic in 3 months.  Marshell Garfinkel MD  Pulmonary and Critical Care Pager [phone] If no answer or after 3pm call: [phone] 01/18/2016, 9:56 AM

## 2016-01-18 NOTE — Patient Instructions (Signed)
We will start you on Auto CPAP set up with pressure setting of 5 to 15 cm H2O.   Return in 3 months.

## 2016-01-18 NOTE — Progress Notes (Signed)
PFT done today. 

## 2016-01-20 ENCOUNTER — Encounter: Payer: PRIVATE HEALTH INSURANCE | Admitting: Family Medicine

## 2016-02-15 ENCOUNTER — Encounter: Payer: Self-pay | Admitting: Family Medicine

## 2016-02-15 ENCOUNTER — Ambulatory Visit (INDEPENDENT_AMBULATORY_CARE_PROVIDER_SITE_OTHER): Payer: PRIVATE HEALTH INSURANCE | Admitting: Family Medicine

## 2016-02-15 VITALS — BP 124/82 | HR 75 | Temp 98.1°F | Resp 16 | Ht 77.0 in | Wt 299.0 lb

## 2016-02-15 DIAGNOSIS — R079 Chest pain, unspecified: Secondary | ICD-10-CM | POA: Diagnosis not present

## 2016-02-15 DIAGNOSIS — Z0000 Encounter for general adult medical examination without abnormal findings: Secondary | ICD-10-CM

## 2016-02-15 NOTE — Progress Notes (Signed)
Name: Chris Baldwin   MRN: [id number]    DOB: 06/08/1976   Date:02/15/2016       Progress Note  Subjective  Chief Complaint  Chief Complaint  Patient presents with  . Annual Exam    patient is due for his Tdap immunization.  . Labs Only    patient has not had any recent blood work and is due for his HIV screening.  . Back Pain    flare up of chronic low back pain.    HPI  Pt. Presents for Complete Physical Exam. He is doing well.  Past Medical History  Diagnosis Date  . GERD (gastroesophageal reflux disease)   . Allergy     Past Surgical History  Procedure Laterality Date  . Tonsillectomy Bilateral     in childhood  . Appendectomy      in young adult years  . Surgery scrotal / testicular      young adult years    Family History  Problem Relation Age of Onset  . Heart disease Mother   . Diabetes Mother   . Cancer Mother     colon  . Arthritis Mother   . Heart disease Father   . Arthritis Father     Social History   Social History  . Marital Status: Single    Spouse Name: N/A  . Number of Children: N/A  . Years of Education: N/A   Occupational History  . Not on file.   Social History Main Topics  . Smoking status: Current Some Day Smoker -- 1 years    Types: Cigarettes  . Smokeless tobacco: Never Used  . Alcohol Use: 0.0 - 0.6 oz/week    0 Standard drinks or equivalent, 0-1 Glasses of wine per week     Comment: occasional  . Drug Use: No  . Sexual Activity:    Partners: Female   Other Topics Concern  . Not on file   Social History Narrative     Current outpatient prescriptions:  .  fluticasone (FLONASE ALLERGY RELIEF) 50 MCG/ACT nasal spray, Place 2 sprays into both nostrils daily., Disp: 16 g, Rfl: 2 .  testosterone cypionate (DEPOTESTOTERONE CYPIONATE) 100 MG/ML injection, Inject 1 mL (100 mg total) into the muscle every 14 (fourteen) days. For IM use only, Disp: 10 mL, Rfl: 0 .  esomeprazole (NEXIUM) 40 MG capsule, Take 40 mg by  mouth daily at 12 noon., Disp: , Rfl:   Allergies  Allergen Reactions  . Nexium [Esomeprazole Magnesium] Other (See Comments)    Brand name     Review of Systems  Constitutional: Negative for fever, chills and weight loss.  HENT: Negative for congestion and sore throat.   Eyes: Positive for blurred vision.  Respiratory: Negative for cough and shortness of breath.   Cardiovascular: Positive for chest pain (left sided chest pain contributed to gas pains.).  Gastrointestinal: Positive for blood in stool (speck of blood on the toilet paper intermittently.). Negative for heartburn (controlled on PPI.), nausea, vomiting, abdominal pain, diarrhea and constipation.  Genitourinary: Negative for dysuria and hematuria.  Musculoskeletal: Positive for back pain. Negative for neck pain.  Skin: Negative for rash.  Neurological: Negative for dizziness and headaches.  Psychiatric/Behavioral: Negative for depression. The patient is nervous/anxious. The patient does not have insomnia.     Objective  Filed Vitals:   02/15/16 0904  BP: 124/82  Pulse: 75  Temp: 98.1 F (36.7 C)  TempSrc: Oral  Resp: 16  Height: 6\' 5"  (  1.956 m)  Weight: 299 lb (135.626 kg)  SpO2: 98%    Physical Exam  Constitutional: He is oriented to person, place, and time and well-developed, well-nourished, and in no distress.  HENT:  Head: Normocephalic and atraumatic.  Mouth/Throat: No posterior oropharyngeal erythema.  R. Ear canal impacted with cerumen. L. Ear canal normal, not able to visualize TM.  Eyes: Pupils are equal, round, and reactive to light.  Cardiovascular: Normal rate and regular rhythm.   Pulmonary/Chest: Effort normal and breath sounds normal.  Abdominal: Soft. Bowel sounds are normal.  Genitourinary: Rectum normal and prostate normal. Rectal exam shows no external hemorrhoid. Guaiac negative stool.  Musculoskeletal:       Right ankle: He exhibits no swelling.       Left ankle: He exhibits no  swelling.  Neurological: He is alert and oriented to person, place, and time.  Psychiatric: Mood, memory, affect and judgment normal.  Nursing note and vitals reviewed.     Assessment & Plan  1. Annual physical exam Age appropriate laboratory screenings ordered. Followed by gastroenterology for screening colonoscopies - Comprehensive Metabolic Panel (CMET) - Lipid Profile - Vitamin D (25 hydroxy) - TSH  2. Left sided chest pain EKG is normal sinus rhythm, likely musculoskeletal versus associated with GERD. - EKG 12-Lead   Natanel Snavely [facility] 02/15/2016 9:22 AM

## 2016-02-16 LAB — COMPREHENSIVE METABOLIC PANEL
ALBUMIN: 4.6 g/dL (ref 3.5–5.5)
ALT: 28 IU/L (ref 0–44)
AST: 24 IU/L (ref 0–40)
Albumin/Globulin Ratio: 1.9 (ref 1.2–2.2)
Alkaline Phosphatase: 49 IU/L (ref 39–117)
BUN / CREAT RATIO: 14 (ref 9–20)
BUN: 13 mg/dL (ref 6–20)
Bilirubin Total: 0.3 mg/dL (ref 0.0–1.2)
CALCIUM: 9.3 mg/dL (ref 8.7–10.2)
CO2: 23 mmol/L (ref 18–29)
CREATININE: 0.95 mg/dL (ref 0.76–1.27)
Chloride: 103 mmol/L (ref 96–106)
GFR, EST AFRICAN AMERICAN: 116 mL/min/{1.73_m2} (ref >59)
GFR, EST NON AFRICAN AMERICAN: 100 mL/min/{1.73_m2} (ref >59)
GLOBULIN, TOTAL: 2.4 g/dL (ref 1.5–4.5)
Glucose: 95 mg/dL (ref 65–99)
Potassium: 4.6 mmol/L (ref 3.5–5.2)
SODIUM: 140 mmol/L (ref 134–144)
Total Protein: 7 g/dL (ref 6.0–8.5)

## 2016-02-16 LAB — LIPID PANEL
CHOL/HDL RATIO: 6.1 ratio — AB (ref 0.0–5.0)
Cholesterol, Total: 190 mg/dL (ref 100–199)
HDL: 31 mg/dL — ABNORMAL LOW (ref >39)
LDL CALC: 121 mg/dL — AB (ref 0–99)
TRIGLYCERIDES: 188 mg/dL — AB (ref 0–149)
VLDL Cholesterol Cal: 38 mg/dL (ref 5–40)

## 2016-02-16 LAB — TSH: TSH: 1.13 u[IU]/mL (ref 0.450–4.500)

## 2016-02-16 LAB — VITAMIN D 25 HYDROXY (VIT D DEFICIENCY, FRACTURES): Vit D, 25-Hydroxy: 24.3 ng/mL — ABNORMAL LOW (ref 30.0–100.0)

## 2016-02-18 ENCOUNTER — Telehealth: Payer: Self-pay

## 2016-02-18 MED ORDER — VITAMIN D (ERGOCALCIFEROL) 1.25 MG (50000 UNIT) PO CAPS: 50000.0000 [IU] | ORAL_CAPSULE | ORAL | Status: DC

## 2016-02-18 NOTE — Telephone Encounter (Signed)
Lab results have been reported to patient and a prescription for Vitamin D 50,000 units take 1 capsule once a week for 12 weeks has been sent Chris Baldwin hopedale per Dr. Manuella Ghazi, patient has been notified and verbalized understanding

## 2016-03-12 ENCOUNTER — Other Ambulatory Visit: Payer: Self-pay | Admitting: Family Medicine

## 2016-03-15 ENCOUNTER — Telehealth: Payer: Self-pay | Admitting: Family Medicine

## 2016-03-15 NOTE — Telephone Encounter (Signed)
Would like to know if he is suppose to refill his vitamin prescription or is he only suppose to take it for that one month?

## 2016-03-15 NOTE — Telephone Encounter (Signed)
Patient informed and thanks you °

## 2016-03-15 NOTE — Telephone Encounter (Signed)
Directions of use was take one per week for 12 weeks

## 2016-03-16 ENCOUNTER — Encounter: Payer: Self-pay | Admitting: Pulmonary Disease

## 2016-04-19 ENCOUNTER — Ambulatory Visit: Payer: No Typology Code available for payment source | Admitting: Pulmonary Disease

## 2016-04-22 ENCOUNTER — Ambulatory Visit: Payer: No Typology Code available for payment source | Admitting: Pulmonary Disease

## 2016-04-27 ENCOUNTER — Ambulatory Visit
Admission: RE | Admit: 2016-04-27 | Discharge: 2016-04-27 | Disposition: A | Discharge status: Home / Self Care | Payer: No Typology Code available for payment source | Source: Ambulatory Visit | Attending: Unknown Physician Specialty | Admitting: Unknown Physician Specialty

## 2016-04-27 ENCOUNTER — Encounter
Admission: RE | Disposition: A | Discharge status: Home / Self Care | Payer: Self-pay | Source: Ambulatory Visit | Attending: Unknown Physician Specialty

## 2016-04-27 ENCOUNTER — Ambulatory Visit: Payer: No Typology Code available for payment source | Admitting: Anesthesiology

## 2016-04-27 DIAGNOSIS — G4730 Sleep apnea, unspecified: Secondary | ICD-10-CM | POA: Diagnosis not present

## 2016-04-27 DIAGNOSIS — K635 Polyp of colon: Secondary | ICD-10-CM | POA: Insufficient documentation

## 2016-04-27 DIAGNOSIS — Z79899 Other long term (current) drug therapy: Secondary | ICD-10-CM | POA: Diagnosis not present

## 2016-04-27 DIAGNOSIS — Z888 Allergy status to other drugs, medicaments and biological substances status: Secondary | ICD-10-CM | POA: Diagnosis not present

## 2016-04-27 DIAGNOSIS — Z6834 Body mass index (BMI) 34.0-34.9, adult: Secondary | ICD-10-CM | POA: Insufficient documentation

## 2016-04-27 DIAGNOSIS — K640 First degree hemorrhoids: Secondary | ICD-10-CM | POA: Insufficient documentation

## 2016-04-27 DIAGNOSIS — E669 Obesity, unspecified: Secondary | ICD-10-CM | POA: Diagnosis not present

## 2016-04-27 DIAGNOSIS — K219 Gastro-esophageal reflux disease without esophagitis: Secondary | ICD-10-CM | POA: Insufficient documentation

## 2016-04-27 DIAGNOSIS — G4733 Obstructive sleep apnea (adult) (pediatric): Secondary | ICD-10-CM

## 2016-04-27 DIAGNOSIS — Z1211 Encounter for screening for malignant neoplasm of colon: Secondary | ICD-10-CM | POA: Diagnosis not present

## 2016-04-27 DIAGNOSIS — Z87891 Personal history of nicotine dependence: Secondary | ICD-10-CM | POA: Diagnosis not present

## 2016-04-27 HISTORY — DX: Sleep apnea, unspecified: G47.30

## 2016-04-27 HISTORY — PX: COLONOSCOPY WITH PROPOFOL: SHX5780

## 2016-04-27 SURGERY — COLONOSCOPY WITH PROPOFOL
Anesthesia: General

## 2016-04-27 MED ORDER — PHENYLEPHRINE HCL 10 MG/ML IJ SOLN
INTRAMUSCULAR | Status: DC | PRN
  Administered 2016-04-27: 100 ug via INTRAVENOUS

## 2016-04-27 MED ORDER — SODIUM CHLORIDE 0.9 % IV SOLN: INTRAVENOUS | Status: DC

## 2016-04-27 MED ORDER — PROPOFOL 500 MG/50ML IV EMUL
INTRAVENOUS | Status: DC | PRN
  Administered 2016-04-27: 180 ug/kg/min via INTRAVENOUS

## 2016-04-27 MED ORDER — MIDAZOLAM HCL 5 MG/5ML IJ SOLN
INTRAMUSCULAR | Status: DC | PRN
  Administered 2016-04-27: 1 mg via INTRAVENOUS

## 2016-04-27 MED ORDER — FENTANYL CITRATE (PF) 100 MCG/2ML IJ SOLN
INTRAMUSCULAR | Status: DC | PRN
  Administered 2016-04-27: 50 ug via INTRAVENOUS

## 2016-04-27 MED ORDER — PROPOFOL 10 MG/ML IV BOLUS
INTRAVENOUS | Status: DC | PRN
  Administered 2016-04-27: 100 mg via INTRAVENOUS
  Administered 2016-04-27: 50 mg via INTRAVENOUS

## 2016-04-27 MED ORDER — SODIUM CHLORIDE 0.9 % IV SOLN
INTRAVENOUS | Status: DC
  Administered 2016-04-27: 13:00:00 via INTRAVENOUS
  Administered 2016-04-27: 1000 mL via INTRAVENOUS

## 2016-04-27 MED ORDER — LIDOCAINE 2% (20 MG/ML) 5 ML SYRINGE
INTRAMUSCULAR | Status: DC | PRN
  Administered 2016-04-27: 40 mg via INTRAVENOUS

## 2016-04-27 NOTE — H&P (Signed)
   Primary Care Physician:  Keith Rake, MD Primary Gastroenterologist:  Dr. Vira Agar  Pre-Procedure History & Physical: HPI:  Chris Baldwin is a 40 y.o. male is here for an colonoscopy.   Past Medical History  Diagnosis Date  . GERD (gastroesophageal reflux disease)   . Allergy   . Sleep apnea     Past Surgical History  Procedure Laterality Date  . Tonsillectomy Bilateral     in childhood  . Appendectomy      in young adult years  . Surgery scrotal / testicular      young adult years    Prior to Admission medications   Medication Sig Start Date End Date Taking? Authorizing Provider  esomeprazole (NEXIUM) 40 MG capsule Take 40 mg by mouth daily at 12 noon.    Historical Provider, MD  fluticasone (FLONASE) 50 MCG/ACT nasal spray USE TWO SPRAY(S) IN EACH NOSTRIL ONCE DAILY 03/14/16   Roselee Nova, MD  testosterone cypionate (DEPOTESTOTERONE CYPIONATE) 100 MG/ML injection Inject 1 mL (100 mg total) into the muscle every 14 (fourteen) days. For IM use only 12/24/15   Roselee Nova, MD  Vitamin D, Ergocalciferol, (DRISDOL) 50000 units CAPS capsule Take 1 capsule (50,000 Units total) by mouth once a week. For 12 weeks 02/18/16   Roselee Nova, MD    Allergies as of 04/08/2016 - Review Complete 02/15/2016  Allergen Reaction Noted  . Nexium [esomeprazole magnesium] Other (See Comments) 02/15/2016    Family History  Problem Relation Age of Onset  . Heart disease Mother   . Diabetes Mother   . Cancer Mother     colon  . Arthritis Mother   . Heart disease Father   . Arthritis Father     Social History   Social History  . Marital Status: Single    Spouse Name: N/A  . Number of Children: N/A  . Years of Education: N/A   Occupational History  . Not on file.   Social History Main Topics  . Smoking status: Former Smoker -- 1 years    Types: Cigarettes  . Smokeless tobacco: Never Used  . Alcohol Use: 0.0 - 0.6 oz/week    0-1 Glasses of wine, 0 Standard drinks  or equivalent per week     Comment: occasional  . Drug Use: No  . Sexual Activity:    Partners: Female   Other Topics Concern  . Not on file   Social History Narrative    Review of Systems: See HPI, otherwise negative ROS  Physical Exam: BP 142/88 mmHg  Pulse 82  Temp(Src) 98.9 F (37.2 C) (Tympanic)  Resp 17  Ht 6\' 5"  (1.956 m)  Wt 133.811 kg (295 lb)  BMI 34.97 kg/m2  SpO2 98% General:   Alert,  pleasant and cooperative in NAD Head:  Normocephalic and atraumatic. Neck:  Supple; no masses or thyromegaly. Lungs:  Clear throughout to auscultation.    Heart:  Regular rate and rhythm. Abdomen:  Soft, nontender and nondistended. Normal bowel sounds, without guarding, and without rebound.   Neurologic:  Alert and  oriented x4;  grossly normal neurologically.  Impression/Plan: [name] is here for an colonoscopy to be performed for [facility] colon polyps and [facility] in mother  Risks, benefits, limitations, and alternatives regarding  colonoscopy have been reviewed with the patient.  Questions have been answered.  All parties agreeable.   Gaylyn Cheers, MD  04/27/2016, 12:58 PM

## 2016-04-27 NOTE — Anesthesia Preprocedure Evaluation (Signed)
Anesthesia Evaluation  Patient identified by MRN, date of birth, ID band Patient awake    Reviewed: Allergy & Precautions, NPO status , Patient's Chart, lab work & pertinent test results  Airway Mallampati: II       Dental  (+) Teeth Intact   Pulmonary sleep apnea , former smoker,    breath sounds clear to auscultation       Cardiovascular Exercise Tolerance: Good  Rhythm:Regular     Neuro/Psych negative neurological ROS  negative psych ROS   GI/Hepatic Neg liver ROS, GERD  Medicated,  Endo/Other  negative endocrine ROS  Renal/GU negative Renal ROS     Musculoskeletal   Abdominal (+) + obese,   Peds  Hematology negative hematology ROS (+)   Anesthesia Other Findings   Reproductive/Obstetrics                             Anesthesia Physical Anesthesia Plan  ASA: II  Anesthesia Plan: General   Post-op Pain Management:    Induction: Intravenous  Airway Management Planned: Natural Airway and Nasal Cannula  Additional Equipment:   Intra-op Plan:   Post-operative Plan:   Informed Consent: I have reviewed the patients History and Physical, chart, labs and discussed the procedure including the risks, benefits and alternatives for the proposed anesthesia with the patient or authorized representative who has indicated his/her understanding and acceptance.     Plan Discussed with: CRNA  Anesthesia Plan Comments:         Anesthesia Quick Evaluation

## 2016-04-27 NOTE — Transfer of Care (Signed)
Immediate Anesthesia Transfer of Care Note  Patient: Chris Baldwin  Procedure(s) Performed: Procedure(s): COLONOSCOPY WITH PROPOFOL (N/A)  Patient Location: PACU and Endoscopy Unit  Anesthesia Type:General  Level of Consciousness: oriented and patient cooperative  Airway & Oxygen Therapy: Patient Spontanous Breathing and Patient connected to nasal cannula oxygen  Post-op Assessment: Report given to RN and Post -op Vital signs reviewed and stable  Post vital signs: stable  Last Vitals:  Filed Vitals:   04/27/16 1223  BP: 142/88  Pulse: 82  Temp: 37.2 C  Resp: 17    Last Pain: There were no vitals filed for this visit.       Complications: No apparent anesthesia complications

## 2016-04-27 NOTE — Op Note (Addendum)
[facility] Patient Name: Chris Baldwin Procedure Date: 04/27/2016 1:02 PM MRN: [id number] Account #: [id number] Date of Birth: 09/02/1976 Admit Type: Outpatient Age: 40 Room: [facility] ENDO ROOM 4 Gender: Male Note Status: Finalized Procedure:            Colonoscopy Indications:          High risk colon cancer surveillance: Personal history                        of colonic polyps Providers:            Manya Silvas, MD Referring MD:         Otila Back. Manuella Ghazi (Referring MD) Medicines:            Propofol per Anesthesia Complications:        No immediate complications. Procedure:            Pre-Anesthesia Assessment:                       - After reviewing the risks and benefits, the patient                        was deemed in satisfactory condition to undergo the                        procedure.                       - General anesthesia under the supervision of a CRNA                        was determined to be medically necessary for this                        procedure based on review of the patient's medical                        history, medications, and prior anesthesia history.                       After obtaining informed consent, the colonoscope was                        passed under direct vision. Throughout the procedure,                        the patient's blood pressure, pulse, and oxygen                        saturations were monitored continuously. The                        Colonoscope was introduced through the anus and                        advanced to the the cecum, identified by appendiceal                        orifice and ileocecal valve. The colonoscopy was  performed without difficulty. The patient tolerated the                        procedure well. The quality of the bowel preparation                        was good. Findings:      A diminutive polyp was found in the sigmoid colon. The polyp  was       sessile. The polyp was removed with a jumbo cold forceps. Resection and       retrieval were complete.      Internal hemorrhoids were found during endoscopy. The hemorrhoids were       small and Grade I (internal hemorrhoids that do not prolapse).      The exam was otherwise without abnormality. Impression:           - One diminutive polyp in the sigmoid colon, removed                        with a jumbo cold forceps. Resected and retrieved.                       - Internal hemorrhoids.                       - The examination was otherwise normal. Recommendation:       - The findings and recommendations were discussed with                        the patient's family. Manya Silvas, MD 04/27/2016 1:37:25 PM This report has been signed electronically. Number of Addenda: 0 Note Initiated On: 04/27/2016 1:02 PM Scope Withdrawal Time: 0 hours 12 minutes 38 seconds  Total Procedure Duration: 0 hours 26 minutes 54 seconds       [facility]

## 2016-04-27 NOTE — Anesthesia Postprocedure Evaluation (Signed)
Anesthesia Post Note  Patient: Chris Baldwin  Procedure(s) Performed: Procedure(s) (LRB): COLONOSCOPY WITH PROPOFOL (N/A)  Patient location during evaluation: PACU Anesthesia Type: General Level of consciousness: awake Pain management: satisfactory to patient Vital Signs Assessment: post-procedure vital signs reviewed and stable Respiratory status: nonlabored ventilation Cardiovascular status: stable Anesthetic complications: no    Last Vitals:  Filed Vitals:   04/27/16 1223 04/27/16 1342  BP: 142/88 137/84  Pulse: 82 72  Temp: 37.2 C 35.6 C  Resp: 17 18    Last Pain: There were no vitals filed for this visit.               VAN STAVEREN,Larenzo Caples

## 2016-04-28 ENCOUNTER — Encounter: Payer: Self-pay | Admitting: Unknown Physician Specialty

## 2016-04-28 LAB — SURGICAL PATHOLOGY

## 2016-05-12 ENCOUNTER — Other Ambulatory Visit: Payer: Self-pay | Admitting: Family Medicine

## 2016-06-28 ENCOUNTER — Other Ambulatory Visit: Payer: Self-pay | Admitting: Family Medicine

## 2016-06-28 DIAGNOSIS — R7989 Other specified abnormal findings of blood chemistry: Secondary | ICD-10-CM

## 2016-06-29 ENCOUNTER — Telehealth: Payer: Self-pay | Admitting: Family Medicine

## 2016-06-29 NOTE — Telephone Encounter (Signed)
APPT MADE FOR NEXT TUESDAY

## 2016-06-29 NOTE — Telephone Encounter (Signed)
He needs labs drawn every 3-4 months. Please schedule for an appointment

## 2016-07-01 NOTE — Telephone Encounter (Signed)
DONE

## 2016-07-05 ENCOUNTER — Encounter: Payer: Self-pay | Admitting: Family Medicine

## 2016-07-05 ENCOUNTER — Ambulatory Visit (INDEPENDENT_AMBULATORY_CARE_PROVIDER_SITE_OTHER): Payer: PRIVATE HEALTH INSURANCE | Admitting: Family Medicine

## 2016-07-05 VITALS — BP 128/68 | HR 71 | Temp 98.4°F | Resp 16 | Ht 77.0 in | Wt 299.8 lb

## 2016-07-05 DIAGNOSIS — E785 Hyperlipidemia, unspecified: Secondary | ICD-10-CM

## 2016-07-05 DIAGNOSIS — E559 Vitamin D deficiency, unspecified: Secondary | ICD-10-CM | POA: Diagnosis not present

## 2016-07-05 DIAGNOSIS — E291 Testicular hypofunction: Secondary | ICD-10-CM

## 2016-07-05 DIAGNOSIS — R7989 Other specified abnormal findings of blood chemistry: Secondary | ICD-10-CM

## 2016-07-05 DIAGNOSIS — D171 Benign lipomatous neoplasm of skin and subcutaneous tissue of trunk: Secondary | ICD-10-CM | POA: Diagnosis not present

## 2016-07-05 MED ORDER — TESTOSTERONE CYPIONATE 100 MG/ML IM SOLN: 100.0000 mg | INTRAMUSCULAR | 0 refills | Status: DC

## 2016-07-05 NOTE — Progress Notes (Signed)
Name: Chris Baldwin   MRN: [id number]    DOB: 1976/08/02   Date:07/05/2016       Progress Note  Subjective  Chief Complaint  Chief Complaint  Patient presents with  . Medication Refill    HPI  Pt. Presents for follow up on Testosterone replacement therapy, last levels drawn in December 2016 showed Testosterone to be 228 ng/mL. He was started on Testosterone cypionate 100 mg/ml. He feels better while on Testosterone replacement therapy. Not as fatigued, hard to tell if muscle mass has improved but getting more active.   Lumps on Back: Pt. presents today to evaluate two lumps on his back, noticed many years ago but recently felt to be getting bigger. The lumps are not painful, do not drain.He reports that his wife popped them and they drained a whitish milky-like fluid. They resolve after popping it come back a few days to weeks later.  Past Medical History:  Diagnosis Date  . Allergy   . GERD (gastroesophageal reflux disease)   . Sleep apnea     Past Surgical History:  Procedure Laterality Date  . APPENDECTOMY     in young adult years  . COLONOSCOPY WITH PROPOFOL N/A 04/27/2016   Procedure: COLONOSCOPY WITH PROPOFOL;  Surgeon: Manya Silvas, MD;  Location: [facility] ENDOSCOPY;  Service: Endoscopy;  Laterality: N/A;  . SURGERY SCROTAL / TESTICULAR     young adult years  . TONSILLECTOMY Bilateral    in childhood    Family History  Problem Relation Age of Onset  . Heart disease Mother   . Diabetes Mother   . Cancer Mother     colon  . Arthritis Mother   . Heart disease Father   . Arthritis Father     Social History   Social History  . Marital status: Single    Spouse name: N/A  . Number of children: N/A  . Years of education: N/A   Occupational History  . Not on file.   Social History Main Topics  . Smoking status: Former Smoker    Years: 1.00    Types: Cigarettes  . Smokeless tobacco: Never Used  . Alcohol use 0.0 - 0.6 oz/week     Comment: occasional    . Drug use: No  . Sexual activity: Yes    Partners: Female   Other Topics Concern  . Not on file   Social History Narrative  . No narrative on file     Current Outpatient Prescriptions:  .  esomeprazole (NEXIUM) 40 MG capsule, Take 40 mg by mouth daily at 12 noon., Disp: , Rfl:  .  fluticasone (FLONASE) 50 MCG/ACT nasal spray, USE TWO SPRAY(S) IN EACH NOSTRIL ONCE DAILY, Disp: 16 g, Rfl: 2 .  testosterone cypionate (DEPOTESTOTERONE CYPIONATE) 100 MG/ML injection, Inject 1 mL (100 mg total) into the muscle every 14 (fourteen) days. For IM use only, Disp: 10 mL, Rfl: 0 .  Vitamin D, Ergocalciferol, (DRISDOL) 50000 units CAPS capsule, Take 1 capsule (50,000 Units total) by mouth once a week. For 12 weeks (Patient not taking: Reported on 07/05/2016), Disp: 12 capsule, Rfl: 0  Allergies  Allergen Reactions  . Nexium [Esomeprazole Magnesium] Other (See Comments)    Brand name     Review of Systems  Constitutional: Negative for chills, fever and malaise/fatigue.  Respiratory: Negative for cough and shortness of breath.   Cardiovascular: Negative for chest pain.  Gastrointestinal: Negative for abdominal pain, nausea and vomiting.  Genitourinary: Negative for frequency, hematuria  and urgency.    Objective  Vitals:   07/05/16 1520  BP: 128/68  Pulse: 71  Resp: 16  Temp: 98.4 F (36.9 C)  TempSrc: Oral  SpO2: 97%  Weight: 299 lb 12.8 oz (136 kg)  Height: 6\' 5"  (1.956 m)    Physical Exam  Constitutional: He is oriented to person, place, and time and well-developed, well-nourished, and in no distress.  HENT:  Head: Normocephalic and atraumatic.  Cardiovascular: Normal rate, regular rhythm, S1 normal, S2 normal and normal heart sounds.   No murmur heard. Pulmonary/Chest: Effort normal and breath sounds normal. No respiratory distress. He has no wheezes. He has no rhonchi.  Musculoskeletal:       Back:  Two well-defined lumps on the back  First one on left upper side,  round, non tender,  Second one on right middle side, smaller, well-defined, round, non tender.  Neurological: He is alert and oriented to person, place, and time.  Psychiatric: Mood, memory, affect and judgment normal.  Nursing note and vitals reviewed.    Assessment & Plan  1. Low testosterone Repeat testosterone levels, continue on testosterone cypionate - testosterone cypionate (DEPOTESTOTERONE CYPIONATE) 100 MG/ML injection; Inject 1 mL (100 mg total) into the muscle every 14 (fourteen) days. For IM use only  Dispense: 10 mL; Refill: 0 - PSA - Testosterone  2. Lipoma of back Referral to dermatology for possible excision of lipomas. - Ambulatory referral to Dermatology  3. Vitamin D deficiency  - Vitamin D (25 hydroxy)  4. Hyperlipidemia  - Lipid Profile   Brinlyn Cena [facility] 07/05/2016 3:37 PM

## 2016-07-06 LAB — LIPID PANEL
CHOL/HDL RATIO: 6.1 ratio — AB (ref <=5.0)
Cholesterol: 177 mg/dL (ref 125–200)
HDL: 29 mg/dL — AB (ref >=40)
LDL CALC: 106 mg/dL (ref <130)
Triglycerides: 208 mg/dL — ABNORMAL HIGH (ref <150)
VLDL: 42 mg/dL — ABNORMAL HIGH (ref <30)

## 2016-07-06 LAB — PSA: PSA: 0.7 ng/mL (ref <=4.0)

## 2016-07-07 LAB — TESTOSTERONE: TESTOSTERONE: 267 ng/dL (ref 250–827)

## 2016-07-07 LAB — VITAMIN D 25 HYDROXY (VIT D DEFICIENCY, FRACTURES): Vit D, 25-Hydroxy: 29 ng/mL — ABNORMAL LOW (ref 30–100)

## 2017-04-25 ENCOUNTER — Encounter: Payer: Self-pay | Admitting: Family Medicine

## 2017-04-25 ENCOUNTER — Ambulatory Visit (INDEPENDENT_AMBULATORY_CARE_PROVIDER_SITE_OTHER): Payer: PRIVATE HEALTH INSURANCE | Admitting: Family Medicine

## 2017-04-25 VITALS — BP 122/78 | HR 95 | Temp 97.6°F | Resp 17 | Ht 77.0 in | Wt 303.1 lb

## 2017-04-25 DIAGNOSIS — R0982 Postnasal drip: Secondary | ICD-10-CM

## 2017-04-25 MED ORDER — AZITHROMYCIN 250 MG PO TABS: ORAL_TABLET | ORAL | 0 refills | Status: DC

## 2017-04-25 MED ORDER — MOMETASONE FUROATE 50 MCG/ACT NA SUSP: 2.0000 | Freq: Every day | NASAL | 12 refills | Status: DC

## 2017-04-25 NOTE — Progress Notes (Signed)
Name: Chris Baldwin   MRN: [id number]    DOB: [birth year]   Date:04/25/2017       Progress Note  Subjective  Chief Complaint  Chief Complaint  Patient presents with  . Cough    pt states he has sweating wile sitting still x1 month and dizziness    Cough  This is a recurrent problem. The current episode started more than 1 month ago (1.5 months). The problem has been unchanged. The cough is productive of sputum (clear mucus). Associated symptoms include nasal congestion (runny nose), postnasal drip (started 2 weeks ago), shortness of breath, sweats and wheezing. Pertinent negatives include no chills or fever. Associated symptoms comments: Excessive sweating. He has tried nothing for the symptoms.    Past Medical History:  Diagnosis Date  . Allergy   . GERD (gastroesophageal reflux disease)   . Sleep apnea     Past Surgical History:  Procedure Laterality Date  . APPENDECTOMY     in young adult years  . COLONOSCOPY WITH PROPOFOL N/A 04/27/2016   Procedure: COLONOSCOPY WITH PROPOFOL;  Surgeon: Manya Silvas, MD;  Location: [facility] ENDOSCOPY;  Service: Endoscopy;  Laterality: N/A;  . SURGERY SCROTAL / TESTICULAR     young adult years  . TONSILLECTOMY Bilateral    in childhood    Family History  Problem Relation Age of Onset  . Heart disease Mother   . Diabetes Mother   . Cancer Mother        colon  . Arthritis Mother   . Heart disease Father   . Arthritis Father     Social History   Social History  . Marital status: Single    Spouse name: N/A  . Number of children: N/A  . Years of education: N/A   Occupational History  . Not on file.   Social History Main Topics  . Smoking status: Former Smoker    Years: 1.00    Types: Cigarettes  . Smokeless tobacco: Never Used  . Alcohol use 0.0 - 0.6 oz/week     Comment: occasional  . Drug use: No  . Sexual activity: Yes    Partners: Female   Other Topics Concern  . Not on file   Social History Narrative  . No  narrative on file     Current Outpatient Prescriptions:  .  esomeprazole (NEXIUM) 40 MG capsule, Take 40 mg by mouth daily at 12 noon., Disp: , Rfl:  .  fluticasone (FLONASE) 50 MCG/ACT nasal spray, USE TWO SPRAY(S) IN EACH NOSTRIL ONCE DAILY, Disp: 16 g, Rfl: 2 .  testosterone cypionate (DEPOTESTOTERONE CYPIONATE) 100 MG/ML injection, Inject 1 mL (100 mg total) into the muscle every 14 (fourteen) days. For IM use only (Patient not taking: Reported on 04/25/2017), Disp: 10 mL, Rfl: 0 .  Vitamin D, Ergocalciferol, (DRISDOL) 50000 units CAPS capsule, Take 1 capsule (50,000 Units total) by mouth once a week. For 12 weeks (Patient not taking: Reported on 07/05/2016), Disp: 12 capsule, Rfl: 0  Allergies  Allergen Reactions  . Nexium [Esomeprazole Magnesium] Other (See Comments)    Brand name     Review of Systems  Constitutional: Negative for chills and fever.  HENT: Positive for postnasal drip (started 2 weeks ago).   Respiratory: Positive for cough, shortness of breath and wheezing.       Objective  Vitals:   04/25/17 1521  BP: 122/78  Pulse: 95  Resp: 17  Temp: 97.6 F (36.4 C)  TempSrc: Oral  SpO2: 95%  Weight: (!) 303 lb 1.6 oz (137.5 kg)  Height: 6\' 5"  (1.956 m)    Physical Exam  Constitutional: He is oriented to person, place, and time and well-developed, well-nourished, and in no distress.  HENT:  Right Ear: Tympanic membrane and ear canal normal.  Left Ear: Tympanic membrane and ear canal normal.  Nose: Right sinus exhibits no maxillary sinus tenderness and no frontal sinus tenderness. Left sinus exhibits no maxillary sinus tenderness and no frontal sinus tenderness.  Mouth/Throat: Posterior oropharyngeal erythema present.  Right turbinate hypertrophy, mucosal inflammation.  Cardiovascular: Normal rate, regular rhythm, S1 normal, S2 normal and normal heart sounds.   No murmur heard. Pulmonary/Chest: Effort normal. He has no wheezes.  Neurological: He is alert and  oriented to person, place, and time.  Psychiatric: Mood, memory, affect and judgment normal.  Nursing note and vitals reviewed.     Assessment & Plan  1. Post-nasal drainage By history and exam, started on azithromycin and mometasone advised to increase fluid intake. - azithromycin (ZITHROMAX) 250 MG tablet; 2 tabs po day 1, then 1 tab po q day x 4 days  Dispense: 6 tablet; Refill: 0 - mometasone (NASONEX) 50 MCG/ACT nasal spray; Place 2 sprays into the nose daily.  Dispense: 17 g; Refill: 12   Sinaya Minogue [facility] 04/25/2017 3:57 PM

## 2017-04-26 ENCOUNTER — Telehealth: Payer: Self-pay | Admitting: Family Medicine

## 2017-04-26 NOTE — Telephone Encounter (Signed)
Pt called to get update about [place] for his supplies for his C-Pap. Please call him with a update.

## 2017-04-26 NOTE — Telephone Encounter (Signed)
Please contact [facility] to inquire about the supplies for CPAP for this patient. Thank you

## 2017-04-27 NOTE — Telephone Encounter (Signed)
I contacted [facility] and was told that they are waiting for the form to be completed and faxed back to their office. She stated that a request has previously been sent but they have not received a response.   I then asked if we could write it on a script pad and she said yes but it has to be itemized (head guide, mask, tubing with filter, etc...).    Please complete and fax to their office at [phone].  Thanks

## 2017-05-02 NOTE — Telephone Encounter (Signed)
I have not seen the form in my paperwork, please contact [facility] to request the form to be refaxed including the supplies.

## 2017-05-02 NOTE — Telephone Encounter (Signed)
Dr. Manuella Ghazi, I contacted [facility] and was told that they are waiting for the form to be completed and faxed back to their office. She stated that a request has previously been sent but they have not received a response.   I then asked if we could write it on a script pad and she said yes but it has to be itemized (head guide, mask, tubing with filter, etc...).    Please complete and fax to their office at [phone].  Thanks

## 2017-05-02 NOTE — Telephone Encounter (Signed)
Please advise 

## 2017-05-05 ENCOUNTER — Telehealth: Payer: Self-pay

## 2017-05-05 NOTE — Telephone Encounter (Signed)
Form received and was placed on Dr. Trena Platt desk to be reviewed, signed and faxed back to [phone].

## 2017-05-05 NOTE — Telephone Encounter (Signed)
I contacted [facility] at [phone] and spoke with Amber. She informed me that the paperwork was faxed to our office at [phone] on 04/19/17 to the attn of Dr. Manuella Ghazi.   I then asked if she could resend it to my attn so that I could make sure he gets it, but she said that she could only send it to Dr. Manuella Ghazi.   I then asked if she could just resend it and she said that she would.  Chris Baldwin was informed.

## 2017-05-05 NOTE — Telephone Encounter (Signed)
Routed note to [facility] to call [facility] and ask for re-fax of forms for Dr. Manuella Ghazi please

## 2017-05-06 NOTE — Telephone Encounter (Signed)
Patient is being followed by pulmonology for CPAP supplies associated with sleep apnea, this is based on review of notes from March 2017. He should follow up with the specialist

## 2017-05-08 ENCOUNTER — Telehealth: Payer: Self-pay | Admitting: Family Medicine

## 2017-05-08 NOTE — Telephone Encounter (Signed)
Left detailed voicemail

## 2017-05-08 NOTE — Telephone Encounter (Signed)
ok 

## 2017-05-08 NOTE — Telephone Encounter (Signed)
Pt checking status on his sleep apnea supplies. States dr Manuella Ghazi was going to get in touch with [facility] to give them the authorization for supplies.  Also pt was referred to a podiatry however he has lost there number and is needing it. Cannot remember the name of the facility  [phone]

## 2017-05-17 ENCOUNTER — Telehealth: Payer: Self-pay | Admitting: Pulmonary Disease

## 2017-05-17 DIAGNOSIS — G4733 Obstructive sleep apnea (adult) (pediatric): Secondary | ICD-10-CM

## 2017-05-17 NOTE — Telephone Encounter (Signed)
Called and spoke to pt. Pt is requesting a renewal of CPAP supplies. Pt last seen in 01/2016 and was advised to come back in 3 months. Pt is also requesting to change providers from the [place] office to the [place] office.   Dr. Vaughan Browner please advise if you are ok with pt changing to one of the [facility] providers and if it is ok to send rx for cpap supplies, or would you like pt to wait til he is seen in [place] before cpap supplies are sent. Thanks.

## 2017-05-18 NOTE — Telephone Encounter (Signed)
I am ok with him changing to [facility] for OSA follow up and am ok ordering the renewal of CPAP supplies.  Marshell Garfinkel MD [facility] Pager [phone] If no answer or after 3pm call: [phone] 05/18/2017, 11:28 AM

## 2017-05-18 NOTE — Telephone Encounter (Signed)
Spoke with pt. He is aware that PM will renew his CPAP supplies. OV has been scheduled with Ramachandran in [place] on 06/26/2017 at 4:15pm. Nothing further was needed.

## 2017-06-01 ENCOUNTER — Ambulatory Visit (INDEPENDENT_AMBULATORY_CARE_PROVIDER_SITE_OTHER): Payer: PRIVATE HEALTH INSURANCE | Admitting: Family Medicine

## 2017-06-01 ENCOUNTER — Encounter: Payer: Self-pay | Admitting: Family Medicine

## 2017-06-01 VITALS — BP 125/75 | HR 77 | Temp 98.2°F | Resp 16 | Ht 77.0 in | Wt 297.9 lb

## 2017-06-01 DIAGNOSIS — R0981 Nasal congestion: Secondary | ICD-10-CM

## 2017-06-01 DIAGNOSIS — Z0000 Encounter for general adult medical examination without abnormal findings: Secondary | ICD-10-CM

## 2017-06-01 LAB — TSH: TSH: 1.4 mIU/L (ref 0.40–4.50)

## 2017-06-01 LAB — CBC WITH DIFFERENTIAL/PLATELET
BASOS PCT: 1 %
Basophils Absolute: 66 cells/uL (ref 0–200)
EOS ABS: 198 {cells}/uL (ref 15–500)
Eosinophils Relative: 3 %
HEMATOCRIT: 43.9 % (ref 38.5–50.0)
HEMOGLOBIN: 14.9 g/dL (ref 13.2–17.1)
LYMPHS ABS: 2376 {cells}/uL (ref 850–3900)
Lymphocytes Relative: 36 %
MCH: 30.4 pg (ref 27.0–33.0)
MCHC: 33.9 g/dL (ref 32.0–36.0)
MCV: 89.6 fL (ref 80.0–100.0)
MONO ABS: 396 {cells}/uL (ref 200–950)
MPV: 11.3 fL (ref 7.5–12.5)
Monocytes Relative: 6 %
NEUTROS ABS: 3564 {cells}/uL (ref 1500–7800)
Neutrophils Relative %: 54 %
Platelets: 194 10*3/uL (ref 140–400)
RBC: 4.9 MIL/uL (ref 4.20–5.80)
RDW: 13.9 % (ref 11.0–15.0)
WBC: 6.6 10*3/uL (ref 3.8–10.8)

## 2017-06-01 NOTE — Progress Notes (Signed)
Name: Chris Baldwin   MRN: [id number]    DOB: July 26, 1976   Date:06/01/2017       Progress Note  Subjective  Chief Complaint  Chief Complaint  Patient presents with  . Annual Exam    CPE    HPI  Pt. Presents for Complete Physical Exam.    Past Medical History:  Diagnosis Date  . Allergy   . GERD (gastroesophageal reflux disease)   . Sleep apnea     Past Surgical History:  Procedure Laterality Date  . APPENDECTOMY     in young adult years  . COLONOSCOPY WITH PROPOFOL N/A 04/27/2016   Procedure: COLONOSCOPY WITH PROPOFOL;  Surgeon: Chris Silvas, MD;  Location: [facility] ENDOSCOPY;  Service: Endoscopy;  Laterality: N/A;  . SURGERY SCROTAL / TESTICULAR     young adult years  . TONSILLECTOMY Bilateral    in childhood    Family History  Problem Relation Age of Onset  . Heart disease Mother   . Diabetes Mother   . Cancer Mother        colon  . Arthritis Mother   . Heart disease Father   . Arthritis Father     Social History   Social History  . Marital status: Single    Spouse name: N/A  . Number of children: N/A  . Years of education: N/A   Occupational History  . Not on file.   Social History Main Topics  . Smoking status: Former Smoker    Years: 1.00    Types: Cigarettes  . Smokeless tobacco: Never Used  . Alcohol use 0.0 - 0.6 oz/week     Comment: occasional  . Drug use: No  . Sexual activity: Yes    Partners: Female   Other Topics Concern  . Not on file   Social History Narrative  . No narrative on file     Current Outpatient Prescriptions:  .  esomeprazole (NEXIUM) 40 MG capsule, Take 40 mg by mouth daily at 12 noon., Disp: , Rfl:  .  fluticasone (FLONASE) 50 MCG/ACT nasal spray, USE TWO SPRAY(S) IN EACH NOSTRIL ONCE DAILY, Disp: 16 g, Rfl: 2 .  mometasone (NASONEX) 50 MCG/ACT nasal spray, Place 2 sprays into the nose daily., Disp: 17 g, Rfl: 12 .  azithromycin (ZITHROMAX) 250 MG tablet, 2 tabs po day 1, then 1 tab po q day x 4 days  (Patient not taking: Reported on 06/01/2017), Disp: 6 tablet, Rfl: 0 .  testosterone cypionate (DEPOTESTOTERONE CYPIONATE) 100 MG/ML injection, Inject 1 mL (100 mg total) into the muscle every 14 (fourteen) days. For IM use only (Patient not taking: Reported on 06/01/2017), Disp: 10 mL, Rfl: 0  Allergies  Allergen Reactions  . Nexium [Esomeprazole Magnesium] Other (See Comments)    Brand name Brand name     Review of Systems  Constitutional: Positive for malaise/fatigue (feels tired in the evenings). Negative for chills and fever.  HENT: Positive for congestion (sinus congestion. ) and sinus pain. Negative for ear pain and sore throat.   Eyes: Positive for blurred vision (sometimes blurred vision when he is tired). Negative for double vision.  Respiratory: Negative for cough, sputum production and shortness of breath.   Cardiovascular: Negative for chest pain (occasional right sided chest pain.), palpitations and leg swelling.  Gastrointestinal: Negative for abdominal pain, blood in stool, constipation, diarrhea, nausea and vomiting.  Genitourinary: Negative for dysuria and hematuria.  Musculoskeletal: Positive for back pain (chronic low back pain). Negative for neck  pain.  Neurological: Negative for dizziness (one episode of dizziness while he was in the pool doing flops with his daughter.) and headaches.  Psychiatric/Behavioral: Negative for depression. The patient is not nervous/anxious and does not have insomnia.     Objective  Vitals:   06/01/17 1457  BP: 125/75  Pulse: 77  Resp: 16  Temp: 98.2 F (36.8 C)  TempSrc: Oral  Weight: 297 lb 14.4 oz (135.1 kg)  Height: 6\' 5"  (1.956 m)    Physical Exam  Constitutional: He is oriented to person, place, and time and well-developed, well-nourished, and in no distress.  HENT:  Head: Normocephalic and atraumatic.  Right Ear: Tympanic membrane, external ear and ear canal normal.  Left Ear: Tympanic membrane, external ear and ear  canal normal.  Mouth/Throat: No posterior oropharyngeal edema or posterior oropharyngeal erythema.  Nasal mucosal inflammation, turbinate hypertrophy.  Cardiovascular: Normal rate, regular rhythm, S1 normal, S2 normal and normal heart sounds.   No murmur heard. Pulmonary/Chest: Effort normal and breath sounds normal. He has no wheezes. He has no rhonchi.  Abdominal: Soft. Bowel sounds are normal. There is no tenderness.  Musculoskeletal:       Right ankle: He exhibits no swelling.       Left ankle: He exhibits no swelling.  Neurological: He is alert and oriented to person, place, and time.  Psychiatric: Mood, memory, affect and judgment normal.  Nursing note and vitals reviewed.      Assessment & Plan  1. Annual physical exam Obtain age appropriate laboratory screenings - CBC with Differential/Platelet - TSH - VITAMIN D 25 Hydroxy (Vit-D Deficiency, Fractures) - PSA  2. Chronic nasal congestion Patient has not experienced relief with Flonase or Nasonex, will refer to ENT for management - Ambulatory referral to ENT   Chris Baldwin [facility] 06/01/2017 3:03 PM

## 2017-06-02 LAB — VITAMIN D 25 HYDROXY (VIT D DEFICIENCY, FRACTURES): VIT D 25 HYDROXY: 29 ng/mL — AB (ref 30–100)

## 2017-06-02 LAB — PSA: PSA: 0.7 ng/mL (ref <=4.0)

## 2017-06-23 NOTE — Progress Notes (Deleted)
* [facility]     Assessment and Plan:  Dyspnea. -Normal. PFT, likely due to deconditioning, obesity.  Symptoms of OSA. I will schedule him for a sleep  Plan: - Pulmonary function tests - Chest x-ray - Sleep study.  He declined to get a flu vaccine today. Return to clinic in 3 months.   Date: 06/23/2017  MRN# [id number] Chris Baldwin 1976/06/13   Chris Baldwin is a 41 y.o. old male seen in follow up for chief complaint of  No chief complaint on file.    HPI:   The patient is a 41 year old male who seen for dyspnea, he is transferring his care from [place] to here in [place] due to proximity. He has a history of chronic exertional dyspnea, which is thought to be secondary to deconditioning, obesity. He is also known to have obstructive sleep apnea, on CPAP.  PFT 01/18/16; ratio is 108%, FEV1 is 94% of predicted, FVC is 85% of predicted, there is no significant improvement with bronchodilator therapy. TLC is 87% of predicted, RV/TLC ratio is 90%. Diffusion capacity is 82%. Overall this test is consistent with normal pulmonary functions  Medication:    Current Outpatient Prescriptions:  .  azithromycin (ZITHROMAX) 250 MG tablet, 2 tabs po day 1, then 1 tab po q day x 4 days (Patient not taking: Reported on 06/01/2017), Disp: 6 tablet, Rfl: 0 .  esomeprazole (NEXIUM) 40 MG capsule, Take 40 mg by mouth daily at 12 noon., Disp: , Rfl:  .  fluticasone (FLONASE) 50 MCG/ACT nasal spray, USE TWO SPRAY(S) IN EACH NOSTRIL ONCE DAILY, Disp: 16 g, Rfl: 2 .  mometasone (NASONEX) 50 MCG/ACT nasal spray, Place 2 sprays into the nose daily., Disp: 17 g, Rfl: 12 .  testosterone cypionate (DEPOTESTOTERONE CYPIONATE) 100 MG/ML injection, Inject 1 mL (100 mg total) into the muscle every 14 (fourteen) days. For IM use only (Patient not taking: Reported on 06/01/2017), Disp: 10 mL, Rfl: 0   Allergies:  Nexium [esomeprazole magnesium]  Review of  Systems: Gen:  Denies  fever, sweats. HEENT: Denies blurred vision. Cvc:  No dizziness, chest pain or heaviness Resp:   Denies cough or sputum porduction. Gi: Denies swallowing difficulty, stomach pain. constipation, bowel incontinence Gu:  Denies bladder incontinence, burning urine Ext:   No Joint pain, stiffness. Skin: No skin rash, easy bruising. Endoc:  No polyuria, polydipsia. Psych: No depression, insomnia. Other:  All other systems were reviewed and found to be negative other than what is mentioned in the HPI.   Physical Examination:   VS: There were no vitals taken for this visit.  {PHYSICAL EXAM WITH PROVIDER CHOICES:22563} General Appearance: No distress  Neuro:without focal findings,  speech normal,  HEENT: PERRLA, EOM intact. Pulmonary: normal breath sounds, No wheezing.   CardiovascularNormal S1,S2.  No m/r/g.   Abdomen: Benign, Soft, non-tender. Renal:  No costovertebral tenderness  GU:  Not performed at this time. Endoc: No evident thyromegaly, no signs of acromegaly. Skin:   warm, no rash. Extremities: normal, no cyanosis, clubbing.   LABORATORY PANEL:   CBC No results for input(s): WBC, HGB, HCT, PLT in the last 168 hours. ------------------------------------------------------------------------------------------------------------------  Chemistries  No results for input(s): NA, K, CL, CO2, GLUCOSE, BUN, CREATININE, CALCIUM, MG, AST, ALT, ALKPHOS, BILITOT in the last 168 hours.  Invalid input(s): GFRCGP ------------------------------------------------------------------------------------------------------------------  Cardiac Enzymes No results for input(s): TROPONINI in the last 168 hours. ------------------------------------------------------------  RADIOLOGY:   No results found for this or any  previous visit. Results for orders placed during the hospital encounter of 10/19/15  DG Chest 2 View   Narrative CLINICAL DATA:  Intermittent shortness of  breath for 1 year.  EXAM: CHEST  2 VIEW  COMPARISON:  None.  FINDINGS: The cardiac silhouette, mediastinal hilar contours within limits. Mild peribronchial thickening and slight increased interstitial markings could reflect bronchitis or reactive airways disease. No infiltrates, edema or effusions. No worrisome pulmonary lesions. The bony thorax is intact.  IMPRESSION: Possible mild bronchitis or reactive airways disease but no infiltrates or effusions.   Electronically Signed   By: Marijo Sanes M.D.   On: 10/20/2015 08:29    ------------------------------------------------------------------------------------------------------------------  Thank  you for allowing [facility] to assist in the care of your patient. Our recommendations are noted above.  Please contact us if we can be of further service.   Marda Stalker, MD.  [facility] Office Number: [phone]  Patricia Pesa, M.D.  Merton Border, M.D  06/23/2017

## 2017-06-26 ENCOUNTER — Institutional Professional Consult (permissible substitution): Payer: No Typology Code available for payment source | Admitting: Internal Medicine

## 2017-06-27 ENCOUNTER — Institutional Professional Consult (permissible substitution): Payer: No Typology Code available for payment source | Admitting: Internal Medicine

## 2017-07-13 ENCOUNTER — Encounter: Payer: Self-pay | Admitting: Internal Medicine

## 2017-07-17 ENCOUNTER — Encounter: Payer: Self-pay | Admitting: Internal Medicine

## 2017-07-17 ENCOUNTER — Ambulatory Visit (INDEPENDENT_AMBULATORY_CARE_PROVIDER_SITE_OTHER): Payer: No Typology Code available for payment source | Admitting: Internal Medicine

## 2017-07-17 VITALS — BP 130/78 | HR 82 | Resp 16 | Ht 77.0 in | Wt 305.0 lb

## 2017-07-17 DIAGNOSIS — G4733 Obstructive sleep apnea (adult) (pediatric): Secondary | ICD-10-CM | POA: Diagnosis not present

## 2017-07-17 NOTE — Progress Notes (Signed)
* [facility]     Assessment and Plan:  Dyspnea. -Normal. PFT, likely due to deconditioning.  Obstructive sleep apnea -Obstructive sleep apnea, currently on AutoSet 5-15. -Discussed change AutoSet to  8-14, however, he likes the lower pressure starting point of 5, he seems to be doing very well with this, therefore, will continue.     Date: 07/17/2017  MRN# [id number] Chris Baldwin 1976/05/17   Chris Baldwin is a 41 y.o. old male seen in follow up for chief complaint of  Chief Complaint  Patient presents with  . Sleep Apnea    Patient switching from [facility] to [facility]. Pt had to switch to nasal pillows due not being able to not breath out of his nose. He wears 6-8 hrs every night.     HPI:   The patient is a 41 year old male who seen for dyspnea, At last visit it was thought that this was predominantly due to deconditioning. He is also known to have obstructive sleep apnea, on CPAP and returns today for a CPAP follow-up.  Review of download data for 30 days as a 07/13/17; uses greater than 4 hours is 97%. Average usage on days used to 7 hours and 41 minutes. The patient is AutoSet 5-15. Floor appears to be 8, 95th percentile is 9, maximum was 10.4. Residual AHI 0.4.  He has been doing well with his supplies, he is using distilled water and is cleaning his supplies about once per month. He feels that he does well with a starting pressure of 5.   PFT 01/18/16; ratio is 108%, FEV1 is 94% of predicted, FVC is 85% of predicted, there is no significant improvement with bronchodilator therapy. TLC is 87% of predicted, RV/TLC ratio is 90%. Diffusion capacity is 82%. Overall this test is consistent with normal pulmonary functions  Medication:    Current Outpatient Prescriptions:  .  azithromycin (ZITHROMAX) 250 MG tablet, 2 tabs po day 1, then 1 tab po q day x 4 days (Patient not taking: Reported on 06/01/2017), Disp: 6 tablet, Rfl: 0 .   esomeprazole (NEXIUM) 40 MG capsule, Take 40 mg by mouth daily at 12 noon., Disp: , Rfl:  .  fluticasone (FLONASE) 50 MCG/ACT nasal spray, USE TWO SPRAY(S) IN EACH NOSTRIL ONCE DAILY, Disp: 16 g, Rfl: 2 .  mometasone (NASONEX) 50 MCG/ACT nasal spray, Place 2 sprays into the nose daily., Disp: 17 g, Rfl: 12 .  testosterone cypionate (DEPOTESTOTERONE CYPIONATE) 100 MG/ML injection, Inject 1 mL (100 mg total) into the muscle every 14 (fourteen) days. For IM use only (Patient not taking: Reported on 06/01/2017), Disp: 10 mL, Rfl: 0   Allergies:  Nexium [esomeprazole magnesium]  Review of Systems: Gen:  Denies  fever, sweats. HEENT: Denies blurred vision. Cvc:  No dizziness, chest pain or heaviness Resp:   Denies cough or sputum porduction. Gi: Denies swallowing difficulty, stomach pain. constipation, bowel incontinence Gu:  Denies bladder incontinence, burning urine Ext:   No Joint pain, stiffness. Skin: No skin rash, easy bruising. Endoc:  No polyuria, polydipsia. Psych: No depression, insomnia. Other:  All other systems were reviewed and found to be negative other than what is mentioned in the HPI.   Physical Examination:   VS: BP 130/78 (BP Location: Left Arm, Cuff Size: Normal)   Pulse 82   Resp 16   Ht 6\' 5"  (1.956 m)   Wt (!) 305 lb (138.3 kg)   SpO2 96%   BMI 36.17  kg/m    General Appearance: No distress  Neuro:without focal findings,  speech normal,  HEENT: PERRLA, EOM intact. Pulmonary: normal breath sounds, No wheezing.   CardiovascularNormal S1,S2.  No m/r/g.   Abdomen: Benign, Soft, non-tender. Renal:  No costovertebral tenderness  GU:  Not performed at this time. Endoc: No evident thyromegaly, no signs of acromegaly. Skin:   warm, no rash. Extremities: normal, no cyanosis, clubbing.   LABORATORY PANEL:   CBC No results for input(s): WBC, HGB, HCT, PLT in the last 168  hours. ------------------------------------------------------------------------------------------------------------------  Chemistries  No results for input(s): NA, K, CL, CO2, GLUCOSE, BUN, CREATININE, CALCIUM, MG, AST, ALT, ALKPHOS, BILITOT in the last 168 hours.  Invalid input(s): GFRCGP ------------------------------------------------------------------------------------------------------------------  Cardiac Enzymes No results for input(s): TROPONINI in the last 168 hours. ------------------------------------------------------------  RADIOLOGY:   No results found for this or any previous visit. Results for orders placed during the hospital encounter of 10/19/15  DG Chest 2 View   Narrative CLINICAL DATA:  Intermittent shortness of breath for 1 year.  EXAM: CHEST  2 VIEW  COMPARISON:  None.  FINDINGS: The cardiac silhouette, mediastinal hilar contours within limits. Mild peribronchial thickening and slight increased interstitial markings could reflect bronchitis or reactive airways disease. No infiltrates, edema or effusions. No worrisome pulmonary lesions. The bony thorax is intact.  IMPRESSION: Possible mild bronchitis or reactive airways disease but no infiltrates or effusions.   Electronically Signed   By: Marijo Sanes M.D.   On: 10/20/2015 08:29    ------------------------------------------------------------------------------------------------------------------  Thank  you for allowing [facility] to assist in the care of your patient. Our recommendations are noted above.  Please contact us if we can be of further service.   Marda Stalker, MD.  [place] [facility] Office Number: [phone]  Patricia Pesa, M.D.  Merton Border, M.D  07/17/2017

## 2017-07-17 NOTE — Patient Instructions (Addendum)
Continue current CPAP settings.

## 2017-09-04 ENCOUNTER — Ambulatory Visit: Payer: PRIVATE HEALTH INSURANCE | Admitting: Family Medicine

## 2017-09-11 ENCOUNTER — Ambulatory Visit: Payer: PRIVATE HEALTH INSURANCE | Admitting: Family Medicine

## 2017-09-11 ENCOUNTER — Encounter: Payer: Self-pay | Admitting: Family Medicine

## 2017-09-11 DIAGNOSIS — F419 Anxiety disorder, unspecified: Secondary | ICD-10-CM

## 2017-09-11 DIAGNOSIS — F413 Other mixed anxiety disorders: Secondary | ICD-10-CM

## 2017-09-11 DIAGNOSIS — F411 Generalized anxiety disorder: Secondary | ICD-10-CM | POA: Insufficient documentation

## 2017-09-11 MED ORDER — ALPRAZOLAM 0.5 MG PO TABS: 0.5000 mg | ORAL_TABLET | Freq: Two times a day (BID) | ORAL | 0 refills | Status: AC | PRN

## 2017-09-11 MED ORDER — CITALOPRAM HYDROBROMIDE 10 MG PO TABS: 10.0000 mg | ORAL_TABLET | Freq: Every day | ORAL | 2 refills | Status: DC

## 2017-09-11 NOTE — Progress Notes (Signed)
Name: Chris Baldwin   MRN: [id number]    DOB: Jun 06, 1976   Date:09/11/2017       Progress Note  Subjective  Chief Complaint  Chief Complaint  Patient presents with  . Back Pain    medication for her lower pain   . Medication Refill    flonase  . Anxiety    Anxiety  Presents for initial visit. The problem has been unchanged. Symptoms include excessive worry, insomnia, irritability, nervous/anxious behavior and panic. Patient reports no depressed mood. Symptoms occur most days. The severity of symptoms is moderate and causing significant distress. The quality of sleep is fair.   There is no history of anxiety/panic attacks or depression. Past treatments include nothing.    Past Medical History:  Diagnosis Date  . Allergy   . GERD (gastroesophageal reflux disease)   . Sleep apnea     Past Surgical History:  Procedure Laterality Date  . APPENDECTOMY     in young adult years  . SURGERY SCROTAL / TESTICULAR     young adult years  . TONSILLECTOMY Bilateral    in childhood    Family History  Problem Relation Age of Onset  . Heart disease Mother   . Diabetes Mother   . Cancer Mother        colon  . Arthritis Mother   . Heart disease Father   . Arthritis Father     Social History   Socioeconomic History  . Marital status: Single    Spouse name: Not on file  . Number of children: Not on file  . Years of education: Not on file  . Highest education level: Not on file  Social Needs  . Financial resource strain: Not on file  . Food insecurity - worry: Not on file  . Food insecurity - inability: Not on file  . Transportation needs - medical: Not on file  . Transportation needs - non-medical: Not on file  Occupational History  . Not on file  Tobacco Use  . Smoking status: Former Smoker    Years: 1.00    Types: Cigarettes  . Smokeless tobacco: Never Used  Substance and Sexual Activity  . Alcohol use: Yes    Alcohol/week: 0.0 - 0.6 oz    Comment: occasional    . Drug use: No  . Sexual activity: Yes    Partners: Female  Other Topics Concern  . Not on file  Social History Narrative  . Not on file     Current Outpatient Medications:  .  mometasone (NASONEX) 50 MCG/ACT nasal spray, Place 2 sprays into the nose daily., Disp: 17 g, Rfl: 12 .  cetirizine (ZYRTEC) 10 MG tablet, Take 10 mg daily by mouth., Disp: , Rfl:  .  fluticasone (FLONASE) 50 MCG/ACT nasal spray, USE TWO SPRAY(S) IN EACH NOSTRIL ONCE DAILY (Patient not taking: Reported on 09/11/2017), Disp: 16 g, Rfl: 2  Allergies  Allergen Reactions  . Nexium [Esomeprazole Magnesium] Other (See Comments)    Brand name Brand name Brand name Brand name Brand name     Review of Systems  Constitutional: Positive for irritability.  Psychiatric/Behavioral: The patient is nervous/anxious and has insomnia.      Objective  Vitals:   09/11/17 1628  BP: 118/68  Pulse: 79  Resp: 14  Temp: 98.2 F (36.8 C)  TempSrc: Oral  SpO2: 97%  Weight: (!) 305 lb 4.8 oz (138.5 kg)  Height: 6\' 5"  (1.956 m)    Physical Exam  Constitutional: He is oriented to person, place, and time and well-developed, well-nourished, and in no distress.  HENT:  Head: Normocephalic and atraumatic.  Cardiovascular: Normal rate, regular rhythm and normal heart sounds.  No murmur heard. Pulmonary/Chest: Effort normal and breath sounds normal. He has no wheezes.  Abdominal: Soft. Bowel sounds are normal.  Musculoskeletal:       Lumbar back: He exhibits tenderness, pain and spasm.       Back:  Neurological: He is alert and oriented to person, place, and time.  Psychiatric: Mood, memory, affect and judgment normal.  Nursing note and vitals reviewed.       Assessment & Plan  1. Other mixed anxiety disorders Chris Baldwin has symptoms of anxiety and panic disorder, we'll start with alprazolam 0.5 mg twice a day when necessary, educated on dependence potential, also started on citalopram 10 mg for treatment of  underlying anxiety, reassess in one month - ALPRAZolam (XANAX) 0.5 MG tablet; Take 1 tablet (0.5 mg total) 2 (two) times daily as needed by mouth for anxiety.  Dispense: 60 tablet; Refill: 0 - citalopram (CELEXA) 10 MG tablet; Take 1 tablet (10 mg total) daily by mouth.  Dispense: 30 tablet; Refill: 2   Chris Baldwin Chris Baldwin 09/11/2017 5:04 PM

## 2017-10-11 ENCOUNTER — Ambulatory Visit: Payer: PRIVATE HEALTH INSURANCE | Admitting: Family Medicine

## 2017-10-23 ENCOUNTER — Ambulatory Visit: Payer: Self-pay | Admitting: *Deleted

## 2017-10-23 NOTE — Telephone Encounter (Signed)
Patient wants provider to know he has sinus congestion- wants to know if he can get an antibiotic. Worked him through the protocol and he is going to try OTC treatment- if no better after 7 days he will call back for appointment. Reason for Disposition . [1] Sinus congestion as part of a cold AND [2] present < 10 days  Answer Assessment - Initial Assessment Questions 1. LOCATION: "Where does it hurt?"      No pain in face 2. ONSET: "When did the sinus pain start?"  (e.g., hours, days)      Started symptoms Friday 3. SEVERITY: "How bad is the pain?"   (Scale 1-10; mild, moderate or severe)   - MILD (1-3): doesn't interfere with normal activities    - MODERATE (4-7): interferes with normal activities (e.g., work or school) or awakens from sleep   - SEVERE (8-10): excruciating pain and patient unable to do any normal activities        No pain 4. RECURRENT SYMPTOM: "Have you ever had sinus problems before?" If so, ask: "When was the last time?" and "What happened that time?"      Yes- sinus infection- 1-2 years ago 5. NASAL CONGESTION: "Is the nose blocked?" If so, ask, "Can you open it or must you breathe through the mouth?"     Patient is congested- breathing through mouth 6. NASAL DISCHARGE: "Do you have discharge from your nose?" If so ask, "What color?"     Clear mucus 7. FEVER: "Do you have a fever?" If so, ask: "What is it, how was it measured, and when did it start?"      Low grade temperature- lasting 1-2 hours - 99 with thermometer  8. OTHER SYMPTOMS: "Do you have any other symptoms?" (e.g., sore throat, cough, earache, difficulty breathing)     Just sinus congestion 9. PREGNANCY: "Is there any chance you are pregnant?" "When was your last menstrual period?"     n/a  Protocols used: SINUS PAIN OR CONGESTION-A-AH

## 2017-10-24 ENCOUNTER — Ambulatory Visit: Payer: PRIVATE HEALTH INSURANCE | Admitting: Family Medicine

## 2017-11-02 ENCOUNTER — Encounter: Payer: Self-pay | Admitting: Family Medicine

## 2017-11-02 ENCOUNTER — Ambulatory Visit: Payer: PRIVATE HEALTH INSURANCE | Admitting: Family Medicine

## 2017-11-02 VITALS — BP 118/62 | HR 78 | Temp 98.0°F | Resp 16 | Wt 312.1 lb

## 2017-11-02 DIAGNOSIS — F413 Other mixed anxiety disorders: Secondary | ICD-10-CM | POA: Diagnosis not present

## 2017-11-02 NOTE — Progress Notes (Signed)
Name: Chris Baldwin   MRN: [id number]    DOB: 1976-03-11   Date:11/02/2017       Progress Note  Subjective  Chief Complaint  Chief Complaint  Patient presents with  . Anxiety    follow- up; Pt states he is taking xanax and its working, depression medication made him sleep a lot    Anxiety  Presents for follow-up visit. Symptoms include excessive worry and nervous/anxious behavior. Patient reports no depressed mood (he has stopped taking Citalopram becasue it made him somnolent and drowsy.), insomnia, panic or restlessness. Primary symptoms comment: taking 1/2 tablet of Xanax 0.5 mg as needed at night for sleep, another one-half during the day as needed. . The severity of symptoms is moderate.     Past Medical History:  Diagnosis Date  . Allergy   . GERD (gastroesophageal reflux disease)   . Sleep apnea     Past Surgical History:  Procedure Laterality Date  . APPENDECTOMY     in young adult years  . COLONOSCOPY WITH PROPOFOL N/A 04/27/2016   Procedure: COLONOSCOPY WITH PROPOFOL;  Surgeon: Manya Silvas, MD;  Location[facility] ENDOSCOPY;  Service: Endoscopy;  Laterality: N/A;  . SURGERY SCROTAL / TESTICULAR     young adult years  . TONSILLECTOMY Bilateral    in childhood    Family History  Problem Relation Age of Onset  . Heart disease Mother   . Diabetes Mother   . Cancer Mother        colon  . Arthritis Mother   . Heart disease Father   . Arthritis Father     Social History   Socioeconomic History  . Marital status: Single    Spouse name: Not on file  . Number of children: Not on file  . Years of education: Not on file  . Highest education level: Not on file  Social Needs  . Financial resource strain: Not on file  . Food insecurity - worry: Not on file  . Food insecurity - inability: Not on file  . Transportation needs - medical: Not on file  . Transportation needs - non-medical: Not on file  Occupational History  . Not on file  Tobacco Use  .  Smoking status: Former Smoker    Years: 1.00    Types: Cigarettes  . Smokeless tobacco: Never Used  Substance and Sexual Activity  . Alcohol use: Yes    Alcohol/week: 0.0 - 0.6 oz    Comment: occasional  . Drug use: No  . Sexual activity: Yes    Partners: Female  Other Topics Concern  . Not on file  Social History Narrative  . Not on file     Current Outpatient Medications:  .  cetirizine (ZYRTEC) 10 MG tablet, Take 10 mg daily by mouth., Disp: , Rfl:  .  fluticasone (FLONASE) 50 MCG/ACT nasal spray, USE TWO SPRAY(S) IN EACH NOSTRIL ONCE DAILY, Disp: 16 g, Rfl: 2 .  citalopram (CELEXA) 10 MG tablet, Take 1 tablet (10 mg total) daily by mouth. (Patient not taking: Reported on 11/02/2017), Disp: 30 tablet, Rfl: 2 .  mometasone (NASONEX) 50 MCG/ACT nasal spray, Place 2 sprays into the nose daily. (Patient not taking: Reported on 11/02/2017), Disp: 17 g, Rfl: 12  Allergies  Allergen Reactions  . Nexium [Esomeprazole Magnesium] Other (See Comments)    Brand name Brand name Brand name Brand name Brand name     Review of Systems  Psychiatric/Behavioral: The patient is nervous/anxious. The patient does  not have insomnia.     Objective  Vitals:   11/02/17 1557  BP: 118/62  Pulse: 78  Resp: 16  Temp: 98 F (36.7 C)  TempSrc: Oral  SpO2: 96%  Weight: (!) 312 lb 1.6 oz (141.6 kg)    Physical Exam  Constitutional: He is oriented to person, place, and time and well-developed, well-nourished, and in no distress.  HENT:  Head: Normocephalic and atraumatic.  Neurological: He is alert and oriented to person, place, and time.  Psychiatric: Mood, memory, affect and judgment normal.  Nursing note and vitals reviewed.       Assessment & Plan  1. Other mixed anxiety disorders He has stopped taking citalopram, takes alprazolam half tablet occasionally as needed for sleep and during the day for anxiety, would make no changes in pharmacotherapy at this time, reassured and  will follow-up with new PCP   Dossie Der Asad A. [facility] 11/02/2017 4:13 PM

## 2018-03-01 ENCOUNTER — Encounter: Payer: Self-pay | Admitting: Family Medicine

## 2018-03-01 ENCOUNTER — Ambulatory Visit: Payer: PRIVATE HEALTH INSURANCE | Admitting: Family Medicine

## 2018-03-01 VITALS — BP 118/68 | HR 78 | Temp 97.9°F | Resp 18 | Ht 77.0 in | Wt 309.7 lb

## 2018-03-01 DIAGNOSIS — G4733 Obstructive sleep apnea (adult) (pediatric): Secondary | ICD-10-CM

## 2018-03-01 DIAGNOSIS — K760 Fatty (change of) liver, not elsewhere classified: Secondary | ICD-10-CM | POA: Diagnosis not present

## 2018-03-01 DIAGNOSIS — E559 Vitamin D deficiency, unspecified: Secondary | ICD-10-CM

## 2018-03-01 DIAGNOSIS — E786 Lipoprotein deficiency: Secondary | ICD-10-CM | POA: Diagnosis not present

## 2018-03-01 DIAGNOSIS — R7989 Other specified abnormal findings of blood chemistry: Secondary | ICD-10-CM | POA: Diagnosis not present

## 2018-03-01 DIAGNOSIS — R5383 Other fatigue: Secondary | ICD-10-CM | POA: Diagnosis not present

## 2018-03-01 DIAGNOSIS — J343 Hypertrophy of nasal turbinates: Secondary | ICD-10-CM

## 2018-03-01 DIAGNOSIS — E669 Obesity, unspecified: Secondary | ICD-10-CM | POA: Diagnosis not present

## 2018-03-01 LAB — CBC WITH DIFFERENTIAL/PLATELET
BASOS ABS: 50 {cells}/uL (ref 0–200)
BASOS PCT: 0.8 %
EOS PCT: 4 %
Eosinophils Absolute: 248 cells/uL (ref 15–500)
HEMATOCRIT: 43.1 % (ref 38.5–50.0)
HEMOGLOBIN: 15 g/dL (ref 13.2–17.1)
LYMPHS ABS: 2108 {cells}/uL (ref 850–3900)
MCH: 30.3 pg (ref 27.0–33.0)
MCHC: 34.8 g/dL (ref 32.0–36.0)
MCV: 87.1 fL (ref 80.0–100.0)
MONOS PCT: 6.9 %
MPV: 12.3 fL (ref 7.5–12.5)
NEUTROS ABS: 3367 {cells}/uL (ref 1500–7800)
Neutrophils Relative %: 54.3 %
Platelets: 214 10*3/uL (ref 140–400)
RBC: 4.95 10*6/uL (ref 4.20–5.80)
RDW: 13.1 % (ref 11.0–15.0)
Total Lymphocyte: 34 %
WBC mixed population: 428 cells/uL (ref 200–950)
WBC: 6.2 10*3/uL (ref 3.8–10.8)

## 2018-03-01 LAB — LIPID PANEL
CHOL/HDL RATIO: 5.5 (calc) — AB (ref <5.0)
CHOLESTEROL: 192 mg/dL (ref <200)
HDL: 35 mg/dL — AB (ref >40)
LDL Cholesterol (Calc): 121 mg/dL (calc) — ABNORMAL HIGH
NON-HDL CHOLESTEROL (CALC): 157 mg/dL — AB (ref <130)
Triglycerides: 251 mg/dL — ABNORMAL HIGH (ref <150)

## 2018-03-01 LAB — COMPLETE METABOLIC PANEL WITH GFR
AG Ratio: 1.8 (calc) (ref 1.0–2.5)
ALBUMIN MSPROF: 4.6 g/dL (ref 3.6–5.1)
ALKALINE PHOSPHATASE (APISO): 54 U/L (ref 40–115)
ALT: 46 U/L (ref 9–46)
AST: 34 U/L (ref 10–40)
BUN: 13 mg/dL (ref 7–25)
CO2: 25 mmol/L (ref 20–32)
CREATININE: 0.85 mg/dL (ref 0.60–1.35)
Calcium: 9.5 mg/dL (ref 8.6–10.3)
Chloride: 107 mmol/L (ref 98–110)
GFR, EST NON AFRICAN AMERICAN: 108 mL/min/{1.73_m2} (ref >=60)
GFR, Est African American: 125 mL/min/{1.73_m2} (ref >=60)
GLUCOSE: 98 mg/dL (ref 65–99)
Globulin: 2.5 g/dL (calc) (ref 1.9–3.7)
Potassium: 4.1 mmol/L (ref 3.5–5.3)
Sodium: 139 mmol/L (ref 135–146)
Total Bilirubin: 0.5 mg/dL (ref 0.2–1.2)
Total Protein: 7.1 g/dL (ref 6.1–8.1)

## 2018-03-01 LAB — TSH: TSH: 1.57 mIU/L (ref 0.40–4.50)

## 2018-03-01 MED ORDER — FLUTICASONE PROPIONATE 50 MCG/ACT NA SUSP: 2.0000 | Freq: Every day | NASAL | 6 refills | Status: DC

## 2018-03-01 NOTE — Assessment & Plan Note (Signed)
Check labs 

## 2018-03-01 NOTE — Assessment & Plan Note (Signed)
Encouraged weight loss 

## 2018-03-01 NOTE — Assessment & Plan Note (Signed)
Check LFTs 

## 2018-03-01 NOTE — Progress Notes (Signed)
BP 118/68 (BP Location: Left Arm, Patient Position: Sitting, Cuff Size: Large)   Pulse 78   Temp 97.9 F (36.6 C) (Oral)   Resp 18   Ht '6\' 5"'$  (1.956 m)   Wt (!) 309 lb 11.2 oz (140.5 kg)   SpO2 93%   BMI 36.73 kg/m    Subjective:    Patient ID: Chris Baldwin, male    DOB: November 28, 1975, 42 y.o.   MRN: [id number]  HPI: Chris Baldwin is a 42 y.o. male  Chief Complaint  Patient presents with  . Fatigue    HPI He is fatigue; was on medicine but quit medicine two years ago He is on healthy testosterone supplement from [facility] for a few weeks; making improvements; was helping a little; has a little extra energy for 3-4 hours; now exhausted just going up stairs He has had other parts of anatomy not working; when using the bathroom, not having full stream, dribbling at the end; hard to empty Vit D deficiency on the last 3 readings; Dr. Manuella Ghazi gave him some pills Baseball coach, EMT, firefighter, gaining back weight from not being able to exercise No hx of anemia No hx of thyroid disease personally or thyroid medicine Mother and father are both passed; mother had colon cancer, diabetes, "the list goes on" High TG and low HDL He had fatty liver and had SGOT and SGPT 58 and 89; saw GI, fatty liver, enzymes came down again Two colonoscopies; 5 years in between OSA; using CPAP maching Nasal congestion; has used nasonex; no discussion about surgery  Depression screen [facility] 2/9 03/01/2018 11/02/2017 09/11/2017 06/01/2017 04/25/2017  Decreased Interest 0 0 0 0 0  Down, Depressed, Hopeless 0 0 0 0 0  PHQ - 2 Score 0 0 0 0 0    Relevant past medical, surgical, family and social history reviewed Past Medical History:  Diagnosis Date  . Allergy   . GERD (gastroesophageal reflux disease)   . Sleep apnea    Past Surgical History:  Procedure Laterality Date  . APPENDECTOMY     in young adult years  . COLONOSCOPY WITH PROPOFOL N/A 04/27/2016   Procedure: COLONOSCOPY WITH PROPOFOL;   Surgeon: Manya Silvas, MD;  Location: [facility] ENDOSCOPY;  Service: Endoscopy;  Laterality: N/A;  . SURGERY SCROTAL / TESTICULAR     young adult years  . TONSILLECTOMY Bilateral    in childhood   Family History  Problem Relation Age of Onset  . Heart disease Mother   . Diabetes Mother   . Cancer Mother        colon  . Arthritis Mother   . Heart disease Father   . Arthritis Father    Social History   Tobacco Use  . Smoking status: Former Smoker    Years: 1.00    Types: Cigarettes  . Smokeless tobacco: Never Used  Substance Use Topics  . Alcohol use: Yes    Alcohol/week: 0.0 - 0.6 oz    Comment: occasional  . Drug use: No    Interim medical history since last visit reviewed. Allergies and medications reviewed  Review of Systems  Constitutional: Positive for fatigue. Negative for unexpected weight change.  Respiratory: Negative for shortness of breath and wheezing.   Cardiovascular: Negative for chest pain.  Genitourinary: Negative for decreased urine volume and hematuria.       Libido has dropped   Neurological:       Right hand tingling, right-handed, maybe carpal tunnel; may  have broken right middle finger; something popped in the right volar wrist, ganglion cyst   Per HPI unless specifically indicated above     Objective:    BP 118/68 (BP Location: Left Arm, Patient Position: Sitting, Cuff Size: Large)   Pulse 78   Temp 97.9 F (36.6 C) (Oral)   Resp 18   Ht '6\' 5"'$  (1.956 m)   Wt (!) 309 lb 11.2 oz (140.5 kg)   SpO2 93%   BMI 36.73 kg/m   Wt Readings from Last 3 Encounters:  03/01/18 (!) 309 lb 11.2 oz (140.5 kg)  11/02/17 (!) 312 lb 1.6 oz (141.6 kg)  09/11/17 (!) 305 lb 4.8 oz (138.5 kg)    Physical Exam  Constitutional: He appears well-developed and well-nourished. No distress.  HENT:  Head: Normocephalic and atraumatic.  Nose: Rhinorrhea present.  Nasal turbinate hypertrophy  Eyes: EOM are normal. No scleral icterus.  Neck: No thyromegaly  present.  Cardiovascular: Normal rate and regular rhythm.  Pulmonary/Chest: Effort normal and breath sounds normal.  Abdominal: Soft. Bowel sounds are normal. He exhibits no distension.  Musculoskeletal: He exhibits no edema.  Neurological: Coordination normal.  Skin: Skin is warm and dry. No pallor.  Psychiatric: He has a normal mood and affect. His mood appears not anxious. He does not exhibit a depressed mood.    Results for orders placed or performed in visit on 06/01/17  CBC with Differential/Platelet  Result Value Ref Range   WBC 6.6 3.8 - 10.8 K/uL   RBC 4.90 4.20 - 5.80 MIL/uL   Hemoglobin 14.9 13.2 - 17.1 g/dL   HCT 43.9 38.5 - 50.0 %   MCV 89.6 80.0 - 100.0 fL   MCH 30.4 27.0 - 33.0 pg   MCHC 33.9 32.0 - 36.0 g/dL   RDW 13.9 11.0 - 15.0 %   Platelets 194 140 - 400 K/uL   MPV 11.3 7.5 - 12.5 fL   Neutro Abs 3,564 1,500 - 7,800 cells/uL   Lymphs Abs 2,376 850 - 3,900 cells/uL   Monocytes Absolute 396 200 - 950 cells/uL   Eosinophils Absolute 198 15 - 500 cells/uL   Basophils Absolute 66 0 - 200 cells/uL   Neutrophils Relative % 54 %   Lymphocytes Relative 36 %   Monocytes Relative 6 %   Eosinophils Relative 3 %   Basophils Relative 1 %   Smear Review Criteria for review not met   TSH  Result Value Ref Range   TSH 1.40 0.40 - 4.50 mIU/L  VITAMIN D 25 Hydroxy (Vit-D Deficiency, Fractures)  Result Value Ref Range   Vit D, 25-Hydroxy 29 (L) 30 - 100 ng/mL  PSA  Result Value Ref Range   PSA 0.7 <=4.0 ng/mL      Assessment & Plan:   Problem List Items Addressed This Visit      Respiratory   Obstructive sleep apnea    Using CPAP        Digestive   Fatty liver    Check LFTs      Relevant Orders   COMPLETE METABOLIC PANEL WITH GFR     Other   Vitamin D deficiency    Check that today and replace if needed      Relevant Orders   VITAMIN D 25 Hydroxy (Vit-D Deficiency, Fractures)   Obesity (BMI 35.0-39.9 without comorbidity)    Encouraged weight  loss      Relevant Orders   TSH   Low testosterone    Check level  and refer to urologist      Relevant Orders   Ambulatory referral to Urology   Testosterone   Low HDL (under 40)   Relevant Orders   Lipid panel   Fatigue - Primary    Check labs      Relevant Orders   Ambulatory referral to Urology   CBC with Differential/Platelet    Other Visit Diagnoses    Nasal turbinate hypertrophy       Relevant Orders   Ambulatory referral to ENT       Follow up plan: Return in about 1 month (around 03/29/2018).  An after-visit summary was printed and given to the patient at [place].  Please see the patient instructions which may contain other information and recommendations beyond what is mentioned above in the assessment and plan.  Meds ordered this encounter  Medications  . fluticasone (FLONASE) 50 MCG/ACT nasal spray    Sig: Place 2 sprays into both nostrils daily.    Dispense:  16 g    Refill:  6    Orders Placed This Encounter  Procedures  . CBC with Differential/Platelet  . COMPLETE METABOLIC PANEL WITH GFR  . Lipid panel  . TSH  . VITAMIN D 25 Hydroxy (Vit-D Deficiency, Fractures)  . Testosterone  . Ambulatory referral to Urology  . Ambulatory referral to ENT

## 2018-03-01 NOTE — Assessment & Plan Note (Signed)
Check level and refer to urologist

## 2018-03-01 NOTE — Assessment & Plan Note (Signed)
Using CPAP 

## 2018-03-01 NOTE — Patient Instructions (Addendum)
Let's get labs today Check out the information at familydoctor.org entitled "Nutrition for Weight Loss: What You Need to Know about Fad Diets" Try to lose between 1-2 pounds per week by taking in fewer calories and burning off more calories You can succeed by limiting portions, limiting foods dense in calories and fat, becoming more active, and drinking 8 glasses of water a day (64 ounces) Don't skip meals, especially breakfast, as skipping meals may alter your metabolism Do not use over-the-counter weight loss pills or gimmicks that claim rapid weight loss A healthy BMI (or body mass index) is between 18.5 and 24.9 You can calculate your ideal BMI at the [place] website ClubMonetize.fr We'll get you to the urologist and the ENT   Preventing Unhealthy Weight Gain, Adult Staying at a healthy weight is important. When fat builds up in your body, you may become overweight or obese. These conditions put you at greater risk for developing certain health problems, such as heart disease, diabetes, sleeping problems, joint problems, and some cancers. Unhealthy weight gain is often the result of making unhealthy choices in what you eat. It is also a result of not getting enough exercise. You can make changes to your lifestyle to prevent obesity and stay as healthy as possible. What nutrition changes can be made? To maintain a healthy weight and prevent obesity:  Eat only as much as your body needs. To do this: ? Pay attention to signs that you are hungry or full. Stop eating as soon as you feel full. ? If you feel hungry, try drinking water first. Drink enough water so your urine is clear or pale yellow. ? Eat smaller portions. ? Look at serving sizes on food labels. Most foods contain more than one serving per container. ? Eat the recommended amount of calories for your gender and activity level. While most active people should eat around 2,000 calories  per day, if you are trying to lose weight or are not very active, you main need to eat less calories. Talk to your health care provider or dietitian about how many calories you should eat each day.  Choose healthy foods, such as: ? Fruits and vegetables. Try to fill at least half of your plate at each meal with fruits and vegetables. ? Whole grains, such as whole wheat bread, brown rice, and quinoa. ? Lean meats, such as chicken or fish. ? Other healthy proteins, such as beans, eggs, or tofu. ? Healthy fats, such as nuts, seeds, fatty fish, and olive oil. ? Low-fat or fat-free dairy.  Check food labels and avoid food and drinks that: ? Are high in calories. ? Have added sugar. ? Are high in sodium. ? Have saturated fats or trans fats.  Limit how much you eat of the following foods: ? Prepackaged meals. ? Fast food. ? Fried foods. ? Processed meat, such as bacon, sausage, and deli meats. ? Fatty cuts of red meat and poultry with skin.  Cook foods in healthier ways, such as by baking, broiling, or grilling.  When grocery shopping, try to shop around the outside of the store. This helps you buy mostly fresh foods and avoid canned and prepackaged foods.  What lifestyle changes can be made?  Exercise at least 30 minutes 5 or more days each week. Exercising includes brisk walking, yard work, biking, running, swimming, and team sports like basketball and soccer. Ask your health care provider which exercises are safe for you.  Do not use any products that contain  nicotine or tobacco, such as cigarettes and e-cigarettes. If you need help quitting, ask your health care provider.  Limit alcohol intake to no more than 1 drink a day for nonpregnant women and 2 drinks a day for men. One drink equals 12 oz of beer, 5 oz of wine, or 1 oz of hard liquor.  Try to get 7-9 hours of sleep each night. What other changes can be made?  Keep a food and activity journal to keep track of: ? What you ate  and how many calories you had. Remember to count sauces, dressings, and side dishes. ? Whether you were active, and what exercises you did. ? Your calorie, weight, and activity goals.  Check your weight regularly. Track any changes. If you notice you have gained weight, make changes to your diet or activity routine.  Avoid taking weight-loss medicines or supplements. Talk to your health care provider before starting any new medicine or supplement.  Talk to your health care provider before trying any new diet or exercise plan. Why are these changes important? Eating healthy, staying active, and having healthy habits not only help prevent obesity, they also:  Help you to manage stress and emotions.  Help you to connect with friends and family.  Improve your self-esteem.  Improve your sleep.  Prevent long-term health problems.  What can happen if changes are not made? Being obese or overweight can cause you to develop joint or bone problems, which can make it hard for you to stay active or do activities you enjoy. Being obese or overweight also puts stress on your heart and lungs and can lead to health problems like diabetes, heart disease, and some cancers. Where to find more information: Talk with your health care provider or a dietitian about healthy eating and healthy lifestyle choices. You may also find other information through these resources:  U.S. Department of Agriculture MyPlate: FormerBoss.no  American Heart Association: www.heart.org  Centers for Disease Control and Prevention: http://www.wolf.info/  Summary  Staying at a healthy weight is important. It helps prevent certain diseases and health problems, such as heart disease, diabetes, joint problems, sleep disorders, and some cancers.  Being obese or overweight can cause you to develop joint or bone problems, which can make it hard for you to stay active or do activities you enjoy.  You can prevent unhealthy weight  gain by eating a healthy diet, exercising regularly, not smoking, limiting alcohol, and getting enough sleep.  Talk with your health care provider or a dietitian for guidance about healthy eating and healthy lifestyle choices. This information is not intended to replace advice given to you by your health care provider. Make sure you discuss any questions you have with your health care provider. Document Released: 10/25/2016 Document Revised: 11/30/2016 Document Reviewed: 11/30/2016 Elsevier Interactive Patient Education  2018 [employer].  Obesity, Adult Obesity is the condition of having too much total body fat. Being overweight or obese means that your weight is greater than what is considered healthy for your body size. Obesity is determined by a measurement called BMI. BMI is an estimate of body fat and is calculated from height and weight. For adults, a BMI of 30 or higher is considered obese. Obesity can eventually lead to other health concerns and major illnesses, including:  Stroke.  Coronary artery disease (CAD).  Type 2 diabetes.  Some types of cancer, including cancers of the colon, breast, uterus, and gallbladder.  Osteoarthritis.  High blood pressure (hypertension).  High cholesterol.  Sleep apnea.  Gallbladder stones.  Infertility problems.  What are the causes? The main cause of obesity is taking in (consuming) more calories than your body uses for energy. Other factors that contribute to this condition may include:  Being born with genes that make you more likely to become obese.  Having a medical condition that causes obesity. These conditions include: ? Hypothyroidism. ? Polycystic ovarian syndrome (PCOS). ? Binge-eating disorder. ? Cushing syndrome.  Taking certain medicines, such as steroids, antidepressants, and seizure medicines.  Not being physically active (sedentary lifestyle).  Living where there are limited places to exercise safely or buy  healthy foods.  Not getting enough sleep.  What increases the risk? The following factors may increase your risk of this condition:  Having a family history of obesity.  Being a woman of African-American descent.  Being a man of Hispanic descent.  What are the signs or symptoms? Having excessive body fat is the main symptom of this condition. How is this diagnosed? This condition may be diagnosed based on:  Your symptoms.  Your medical history.  A physical exam. Your health care provider may measure: ? Your BMI. If you are an adult with a BMI between 25 and less than 30, you are considered overweight. If you are an adult with a BMI of 30 or higher, you are considered obese. ? The distances around your hips and your waist (circumferences). These may be compared to each other to help diagnose your condition. ? Your skinfold thickness. Your health care provider may gently pinch a fold of your skin and measure it.  How is this treated? Treatment for this condition often includes changing your lifestyle. Treatment may include some or all of the following:  Dietary changes. Work with your health care provider and a dietitian to set a weight-loss goal that is healthy and reasonable for you. Dietary changes may include eating: ? Smaller portions. A portion size is the amount of a particular food that is healthy for you to eat at one time. This varies from person to person. ? Low-calorie or low-fat options. ? More whole grains, fruits, and vegetables.  Regular physical activity. This may include aerobic activity (cardio) and strength training.  Medicine to help you lose weight. Your health care provider may prescribe medicine if you are unable to lose 1 pound a week after 6 weeks of eating more healthily and doing more physical activity.  Surgery. Surgical options may include gastric banding and gastric bypass. Surgery may be done if: ? Other treatments have not helped to improve your  condition. ? You have a BMI of 40 or higher. ? You have life-threatening health problems related to obesity.  Follow these instructions at home:  Eating and drinking   Follow recommendations from your health care provider about what you eat and drink. Your health care provider may advise you to: ? Limit fast foods, sweets, and processed snack foods. ? Choose low-fat options, such as low-fat milk instead of whole milk. ? Eat 5 or more servings of fruits or vegetables every day. ? Eat at home more often. This gives you more control over what you eat. ? Choose healthy foods when you eat out. ? Learn what a healthy portion size is. ? Keep low-fat snacks on hand. ? Avoid sugary drinks, such as soda, fruit juice, iced tea sweetened with sugar, and flavored milk. ? Eat a healthy breakfast.  Drink enough water to keep your urine clear or pale yellow.  Do  not go without eating for long periods of time (do not fast) or follow a fad diet. Fasting and fad diets can be unhealthy and even dangerous. Physical Activity  Exercise regularly, as told by your health care provider. Ask your health care provider what types of exercise are safe for you and how often you should exercise.  Warm up and stretch before being active.  Cool down and stretch after being active.  Rest between periods of activity. Lifestyle  Limit the time that you spend in front of your TV, computer, or video game system.  Find ways to reward yourself that do not involve food.  Limit alcohol intake to no more than 1 drink a day for nonpregnant women and 2 drinks a day for men. One drink equals 12 oz of beer, 5 oz of wine, or 1 oz of hard liquor. General instructions  Keep a weight loss journal to keep track of the food you eat and how much you exercise you get.  Take over-the-counter and prescription medicines only as told by your health care provider.  Take vitamins and supplements only as told by your health care  provider.  Consider joining a support group. Your health care provider may be able to recommend a support group.  Keep all follow-up visits as told by your health care provider. This is important. Contact a health care provider if:  You are unable to meet your weight loss goal after 6 weeks of dietary and lifestyle changes. This information is not intended to replace advice given to you by your health care provider. Make sure you discuss any questions you have with your health care provider. Document Released: 12/01/2004 Document Revised: 03/28/2016 Document Reviewed: 08/12/2015 Elsevier Interactive Patient Education  2018 [employer].

## 2018-03-01 NOTE — Assessment & Plan Note (Signed)
Check that today and replace if needed

## 2018-03-02 LAB — VITAMIN D 25 HYDROXY (VIT D DEFICIENCY, FRACTURES): Vit D, 25-Hydroxy: 24 ng/mL — ABNORMAL LOW (ref 30–100)

## 2018-03-02 LAB — TESTOSTERONE: Testosterone: 269 ng/dL (ref 250–827)

## 2018-03-12 ENCOUNTER — Telehealth: Payer: Self-pay | Admitting: Family Medicine

## 2018-03-12 NOTE — Telephone Encounter (Signed)
Called pt told him he would need appt. Scheduled for may 8 with elizabeth

## 2018-03-12 NOTE — Telephone Encounter (Signed)
Copied from [place] [id number]. Topic: Quick Communication - See Telephone Encounter >> Mar 12, 2018  8:17 AM Synthia Innocent wrote: CRM for notification. See Telephone encounter for: 03/12/18. Patient hurt his back Monday 02/23/18, requesting something for pain. Ibuprofen is not helping nor chiropractor. [facility] [address]

## 2018-03-14 ENCOUNTER — Encounter: Payer: Self-pay | Admitting: Emergency Medicine

## 2018-03-14 ENCOUNTER — Encounter: Payer: Self-pay | Admitting: Nurse Practitioner

## 2018-03-14 ENCOUNTER — Ambulatory Visit: Payer: PRIVATE HEALTH INSURANCE | Admitting: Nurse Practitioner

## 2018-03-14 VITALS — BP 136/74 | HR 73 | Temp 98.4°F | Resp 18 | Ht 77.0 in | Wt 304.9 lb

## 2018-03-14 DIAGNOSIS — M5441 Lumbago with sciatica, right side: Secondary | ICD-10-CM

## 2018-03-14 MED ORDER — TIZANIDINE HCL 4 MG PO CAPS: 4.0000 mg | ORAL_CAPSULE | Freq: Three times a day (TID) | ORAL | 1 refills | Status: DC

## 2018-03-14 MED ORDER — PREDNISONE 10 MG (21) PO TBPK: ORAL_TABLET | ORAL | 0 refills | Status: DC

## 2018-03-14 NOTE — Patient Instructions (Addendum)
-   Meloxicam or Ibuprofen (2,400mg  a day) not both ( take with food) - Tylenol as well (limit to 3,000mg  a day)  - Muscle relaxer daily for the next 2-3 days and can take up to three times a day as needed - Use heat and light stretches below an hour after taking medication  - I not improving in the next 3-4 days try prednisone taper. (choose healthy foods)    trying any of these exercises Knee to chest stretch  Start position: Lie on your back on a mat or the carpet. Place a small, flat cushion or book under your head. Bend your knees and keep your feet straight and hip-width apart. Keep your upper body relaxed and your chin gently tucked in. Action: Bend one knee up towards your chest and hold it with both hands. Hold for 20 to 30 seconds with controlled deep breaths.  Repeat 3 times, alternating legs. Tips: Don't tense your neck, chest or shoulders.  Only stretch as far as is comfortable.  Variation: Grasp both knees and press into your chest. Sciatic mobilising stretch  Start position: Lie on your back. Place a small, flat cushion or book under your head. Bend your knees and keep your feet straight and hip-width apart. Keep your upper body relaxed and your chin gently tucked in. Action: Bend one knee up towards your chest. Hold the back of your upper leg with both hands, then slowly straighten the knee. Hold for 20 to 30 seconds, taking deep breaths. Bend the knee and return to the starting position. Repeat 2 or 3 times, alternating legs. Tips: Don't press your lower back down into the floor as you stretch.  Only stretch as far as is comfortable. Back extensions  Start position: Lie on your front and rest on your forearms with your elbows bent at your sides. Look towards the floor and keep your neck straight. Action: Keeping your neck straight, arch your back up by pushing down on your hands. You should feel a gentle stretch in the stomach muscles. Breathe and hold for 5 to 10  seconds. Return to the starting position. Repeat 8 to 10 times. Tips: Don't bend your neck backwards.  Keep your hips on the floor. Standing hamstring stretch Start position: Stand upright and raise one leg on to a stable object, such as a step. Keep that leg straight and your toes pointing up. Action: Lean forward while keeping your back straight. Hold for 20 to 30 seconds while taking deep breaths. Repeat 2 or 3 times with each leg. Tips: Only stretch as far as is comfortable.  Your lower back shouldn't arch at any time. Lying deep gluteal stretch  Start position: Lie on your back. Place a small, flat cushion or book under your head. Bend your left leg and rest your right foot on your left thigh. Action: Grasp your left thigh and pull it towards you. Keep the base of your spine on the floor throughout and your hips straight. You should feel the stretch in your right buttock. Hold for 20 to 30 seconds while taking deep breaths. Repeat 2 or 3 times with each leg. Tip: Use a towel around your thigh if you can't hold it. From: https://www.nhs.uk/live-well/exercise/exercises-for-sciatica/

## 2018-03-14 NOTE — Progress Notes (Addendum)
Name: Chris Baldwin   MRN: [id number]    DOB: 05-24-1976   Date:03/14/2018       Progress Note  Subjective  Chief Complaint  Chief Complaint  Patient presents with  . Back Pain    for 1 week. Pain radiates down right leg.Hard to straighten out leg. Seen Chiropractor 2 times but di not help.    HPI  Patient endorses lower back pain radiates down right leg ongoing for a week. Has tried 800mg  ibuprofen and seen chiropractor without relief of symptoms. Patient states has had chronic back pain since adolescent due to squat lifting. Last few weeks has been overworking back then onTuesday was bending into crawl space and felt lower back pain that has been worsening since then- self treating and chiropractor has not been improving and started radiating down right leg. Patient uses orthotics- custom made. Back pain worse then leg pain, worse when sitting for too long or standing too long. Denies bowel/bladder incontinence, paresthesias.    Patient Active Problem List   Diagnosis Date Noted  . Obesity (BMI 35.0-39.9 without comorbidity) 03/01/2018  . Low HDL (under 40) 03/01/2018  . Fatty liver 03/01/2018  . Anxiety disorder 09/11/2017  . Vitamin D deficiency 07/05/2016  . Annual physical exam 02/15/2016  . Left sided chest pain 02/15/2016  . Obstructive sleep apnea 01/18/2016  . Low testosterone 08/25/2015  . Dyspnea 08/25/2015  . Fatigue 08/25/2015    Past Medical History:  Diagnosis Date  . Allergy   . GERD (gastroesophageal reflux disease)   . Sleep apnea     Past Surgical History:  Procedure Laterality Date  . APPENDECTOMY     in young adult years  . COLONOSCOPY WITH PROPOFOL N/A 04/27/2016   Procedure: COLONOSCOPY WITH PROPOFOL;  Surgeon: Manya Silvas, MD;  Location[facility] ENDOSCOPY;  Service: Endoscopy;  Laterality: N/A;  . SURGERY SCROTAL / TESTICULAR     young adult years  . TONSILLECTOMY Bilateral    in childhood    Social History   Tobacco Use  . Smoking  status: Former Smoker    Years: 1.00    Types: Cigarettes  . Smokeless tobacco: Never Used  Substance Use Topics  . Alcohol use: Yes    Alcohol/week: 0.0 - 0.6 oz    Comment: occasional     Current Outpatient Medications:  .  cetirizine (ZYRTEC) 10 MG tablet, Take 10 mg daily by mouth., Disp: , Rfl:  .  fluticasone (FLONASE) 50 MCG/ACT nasal spray, Place 2 sprays into both nostrils daily. (Patient not taking: Reported on 03/14/2018), Disp: 16 g, Rfl: 6 .  mometasone (NASONEX) 50 MCG/ACT nasal spray, Place 2 sprays into the nose daily. (Patient not taking: Reported on 03/14/2018), Disp: 17 g, Rfl: 12  Allergies  Allergen Reactions  . Nexium [Esomeprazole Magnesium] Other (See Comments)    Brand name Brand name Brand name Brand name Brand name    ROS    No other specific complaints in a complete review of systems (except as listed in HPI above).  Objective  Vitals:   03/14/18 1036  BP: 136/74  Pulse: 73  Resp: 18  Temp: 98.4 F (36.9 C)  TempSrc: Oral  SpO2: 97%  Weight: (!) 304 lb 14.4 oz (138.3 kg)  Height: 6\' 5"  (1.956 m)     Body mass index is 36.16 kg/m.  Nursing Note and Vital Signs reviewed.  Physical Exam   Constitutional: Patient appears well-developed and well-nourished. Obese  No distress.  Cardiovascular:  Normal rate, regular rhythm, S1/S2 present.  No murmur or rub heard.  Pulmonary/Chest: Effort normal and breath sounds clear. No respiratory distress or retractions. MSK: lower lumbar pain, non tender no deformity or redness, positive straight leg test right leg.  Psychiatric: Patient has a normal mood and affect. behavior is normal. Judgment and thought content normal.  No results found for this or any previous visit (from the past 72 hour(s)).  Assessment & Plan  1. Acute right-sided low back pain with right-sided sciatica  - tiZANidine (ZANAFLEX) 4 MG capsule; Take 1 capsule (4 mg total) by mouth 3 (three) times daily.  Dispense: 30  capsule; Refill: 1 - predniSONE (STERAPRED UNI-PAK 21 TAB) 10 MG (21) TBPK tablet; Follow package insert  Dispense: 21 tablet; Refill: 0   - Meloxicam or Ibuprofen (2,400mg  a day) not both ( take with food) - Tylenol as well (limit to 3,000mg  a day)  - Muscle relaxer daily for the next 2-3 days and can take up to three times a day as needed - Use heat and light stretches below an hour after taking medication - If not improving in the next 3-4 days try prednisone taper. (choose healthy foods as it may increase blood sugar and make you hungry)  -Red flags and when to present for emergency care or RTC including fever >101.58F, chest pain, shortness of breath, new/worsening/un-resolving symptoms, bowel/bladder incontinence reviewed with patient at time of visit. Follow up and care instructions discussed and provided in AVS.  ---------------------------------- I have reviewed this encounter including the documentation in this note and/or discussed this patient with the provider, Suezanne Cheshire DNP AGNP-C. I am certifying that I agree with the content of this note as supervising physician. Enid Derry, [facility] 03/29/2018, 5:23 PM

## 2018-03-26 ENCOUNTER — Telehealth: Payer: Self-pay | Admitting: Family Medicine

## 2018-03-26 NOTE — Telephone Encounter (Signed)
Copied from [place] [id number]. Topic: Quick Communication - See Telephone Encounter >> Mar 26, 2018 12:47 PM Vernona Rieger wrote: CRM for notification. See Telephone encounter for: 03/26/18.  Patient said that Chris Baldwin told him at his visit on 5/8 that if he needed more anti inflammatory to just call and she would prescribe that since she already seen him for his back pain. Please advise. Patient did not know the name of the medication.   Call back @ [phone] [facility] [address] [place]

## 2018-03-26 NOTE — Telephone Encounter (Signed)
Please advise 

## 2018-03-26 NOTE — Telephone Encounter (Signed)
Sent to pharmacy as: predniSONE (STERAPRED UNI-PAK 21 TAB) 10 MG (21) Tablet Therapy Pack tablet   E-Prescribing Status: Receipt confirmed by pharmacy (03/14/2018 11:13 AM EDT)    [facility] [address] to be picked up if needed. choose healthy foods as it may increase blood sugar and make you hungry

## 2018-03-27 NOTE — Telephone Encounter (Signed)
Pt returning [facility] phone call.

## 2018-03-27 NOTE — Telephone Encounter (Signed)
Left message for patient to call office and inform of script [facility] if he needed the prednisone.

## 2018-04-06 ENCOUNTER — Telehealth: Payer: Self-pay

## 2018-04-06 MED ORDER — MELOXICAM 15 MG PO TABS: 15.0000 mg | ORAL_TABLET | Freq: Every day | ORAL | 0 refills | Status: DC | PRN

## 2018-04-06 NOTE — Telephone Encounter (Signed)
Normal Cr Rx sent as requested

## 2018-04-06 NOTE — Telephone Encounter (Signed)
Copied from [place] [id number]. Topic: General - Other >> Apr 06, 2018  9:39 AM Carolyn Stare wrote:   Pt called back today to say he is still having back pain and was told to call back, pt is asking for a anti inflammatory  [facility] [address]   [phone]

## 2018-04-11 ENCOUNTER — Telehealth: Payer: Self-pay | Admitting: Family Medicine

## 2018-04-11 ENCOUNTER — Encounter: Payer: Self-pay | Admitting: Urology

## 2018-04-11 ENCOUNTER — Ambulatory Visit (INDEPENDENT_AMBULATORY_CARE_PROVIDER_SITE_OTHER): Payer: PRIVATE HEALTH INSURANCE | Admitting: Urology

## 2018-04-11 VITALS — BP 130/83 | HR 76 | Ht 76.0 in | Wt 300.0 lb

## 2018-04-11 DIAGNOSIS — E291 Testicular hypofunction: Secondary | ICD-10-CM

## 2018-04-11 NOTE — Progress Notes (Signed)
04/11/2018 4:23 PM   Chris Baldwin 01/22/1976 [id number]  Referring provider: Arnetha Courser, MD [address] [place], [place] [address]  Chief Complaint  Patient presents with  . Hypogonadism    New Patient    HPI: Chris Baldwin is a 42 year old male seen in consultation at the request of Dr. Sanda Baldwin for evaluation of hypogonadism.  He was diagnosed with hypogonadism in 2016 and took testosterone injections for approximately 1 month and these were subsequently stopped.  He has significant tiredness, fatigue and decreased libido.  This has improved slightly since he started vitamin D.  He is taking an over-the-counter testosterone supplement.  His most recent testosterone level was low normal in the 260 range.  He has sleep apnea on CPAP.  He has no bothersome lower urinary tract symptoms.  He has mild postvoid dribbling.  He has mild erectile dysfunction.  His last PSA in July 2018 was 0.7.  He denies chronic headache or visual problems.  PMH: Past Medical History:  Diagnosis Date  . Allergy   . GERD (gastroesophageal reflux disease)   . Sleep apnea     Surgical History: Past Surgical History:  Procedure Laterality Date  . APPENDECTOMY     in young adult years  . COLONOSCOPY WITH PROPOFOL N/A 04/27/2016   Procedure: COLONOSCOPY WITH PROPOFOL;  Surgeon: Manya Silvas, MD;  Location: [facility] ENDOSCOPY;  Service: Endoscopy;  Laterality: N/A;  . SURGERY SCROTAL / TESTICULAR     young adult years  . TONSILLECTOMY Bilateral    in childhood    Home Medications:  Allergies as of 04/11/2018      Reactions   Nexium [esomeprazole Magnesium] Other (See Comments)   Brand name Brand name Brand name Brand name Brand name      Medication List        Accurate as of 04/11/18  4:23 PM. Always use your most recent med list.          cetirizine 10 MG tablet Commonly known as:  ZYRTEC Take 10 mg daily by mouth.   fluticasone 50 MCG/ACT nasal spray Commonly  known as:  FLONASE Place 2 sprays into both nostrils daily.   meloxicam 15 MG tablet Commonly known as:  MOBIC Take 1 tablet (15 mg total) by mouth daily as needed for pain.   mometasone 50 MCG/ACT nasal spray Commonly known as:  NASONEX Place 2 sprays into the nose daily.   tiZANidine 4 MG capsule Commonly known as:  ZANAFLEX Take 1 capsule (4 mg total) by mouth 3 (three) times daily.       Allergies:  Allergies  Allergen Reactions  . Nexium [Esomeprazole Magnesium] Other (See Comments)    Brand name Brand name Brand name Brand name Brand name    Family History: Family History  Problem Relation Age of Onset  . Heart disease Mother   . Diabetes Mother   . Cancer Mother        colon  . Arthritis Mother   . Heart disease Father   . Arthritis Father     Social History:  reports that he has quit smoking. His smoking use included cigarettes. He quit after 1.00 year of use. He has never used smokeless tobacco. He reports that he drinks alcohol. He reports that he does not use drugs.  ROS: UROLOGY Frequent Urination?: No Hard to postpone urination?: No Burning/pain with urination?: No Get up at night to urinate?: Yes Leakage of urine?: No Urine stream  starts and stops?: No Trouble starting stream?: No Do you have to strain to urinate?: No Blood in urine?: No Urinary tract infection?: No Sexually transmitted disease?: No Injury to kidneys or bladder?: No Painful intercourse?: No Weak stream?: No Erection problems?: Yes Penile pain?: No  Gastrointestinal Nausea?: No Vomiting?: No Indigestion/heartburn?: Yes Diarrhea?: No Constipation?: Yes  Constitutional Fever: No Night sweats?: No Weight loss?: No Fatigue?: Yes  Skin Skin rash/lesions?: No Itching?: Yes  Eyes Blurred vision?: No Double vision?: No  Ears/Nose/Throat Sore throat?: No Sinus problems?: Yes  Hematologic/Lymphatic Swollen glands?: No Easy bruising?: No  Cardiovascular Leg  swelling?: No Chest pain?: No  Respiratory Cough?: No Shortness of breath?: No  Endocrine Excessive thirst?: Yes  Musculoskeletal Back pain?: Yes Joint pain?: Yes  Neurological Headaches?: No Dizziness?: No  Psychologic Depression?: No Anxiety?: No  Physical Exam: BP 130/83   Pulse 76   Ht 6\' 4"  (1.93 m)   Wt 300 lb (136.1 kg)   BMI 36.52 kg/m   Constitutional:  Alert and oriented, No acute distress. HEENT: [place] AT, moist mucus membranes.  Trachea midline, no masses. Cardiovascular: No clubbing, cyanosis, or edema. Respiratory: Normal respiratory effort, no increased work of breathing. GI: Abdomen is soft, nontender, nondistended, no abdominal masses GU: No CVA tenderness.  Penis circumcised without lesions.  Testes descended bilaterally without masses or tenderness and are of normal size.  No paratesticular abnormalities.  Prostate 40 g smooth without nodules. Lymph: No cervical or inguinal lymphadenopathy. Skin: No rashes, bruises or suspicious lesions. Neurologic: Grossly intact, no focal deficits, moving all 4 extremities. Psychiatric: Normal mood and affect.  Laboratory Data: Lab Results  Component Value Date   WBC 6.2 03/01/2018   HGB 15.0 03/01/2018   HCT 43.1 03/01/2018   MCV 87.1 03/01/2018   PLT 214 03/01/2018    Lab Results  Component Value Date   CREATININE 0.85 03/01/2018    Lab Results  Component Value Date   PSA 0.7 06/01/2017   PSA 0.7 07/05/2016    Lab Results  Component Value Date   TESTOSTERONE 269 03/01/2018    Assessment & Plan:   42 year old male with tiredness, fatigue, decreased libido and history of hypogonadism.  His most recent testosterone levels have been low normal and he was informed it is unlikely testosterone replacement would be covered by insurance.  Will check an a.m. free/total testosterone level.  Would recommend he stop his testosterone supplements.  An LH was also ordered.  Potential side effects of  testosterone replacement were discussed including stimulation of benign prostatic growth with lower urinary tract symptoms; erythrocytosis; edema; gynecomastia; worsening sleep apnea; venous thromboembolism; testicular atrophy and infertility. Recent studies suggesting an increased incidence of heart attack and stroke in patients taking testosterone was discussed. He was informed there is conflicting evidence regarding the impact of testosterone therapy on cardiovascular risk. The theoretical risk of growth stimulation of an undetected prostate cancer was also discussed.  He was informed that current evidence does not provide any definitive answers regarding the risks of testosterone therapy on prostate cancer and cardiovascular disease. The need for periodic monitoring of his testosterone level, PSA, hematocrit and DRE was discussed.   Abbie Sons, [place] [address], [place], [place] [address] [phone]

## 2018-04-11 NOTE — Telephone Encounter (Signed)
Copied from [place] [id number]. Topic: Referral - Request >> Apr 11, 2018 10:07 AM Neva Seat wrote: Pt's back pain is getting worse. Needing to have a referral to someone from Dr. Sanda Klein ASAP please.

## 2018-04-11 NOTE — Telephone Encounter (Signed)
Please advise 

## 2018-04-12 ENCOUNTER — Other Ambulatory Visit: Payer: PRIVATE HEALTH INSURANCE

## 2018-04-12 ENCOUNTER — Encounter: Payer: Self-pay | Admitting: Urology

## 2018-04-12 ENCOUNTER — Other Ambulatory Visit: Payer: Self-pay | Admitting: Emergency Medicine

## 2018-04-12 DIAGNOSIS — M5441 Lumbago with sciatica, right side: Secondary | ICD-10-CM

## 2018-04-12 MED ORDER — TIZANIDINE HCL 4 MG PO CAPS: 4.0000 mg | ORAL_CAPSULE | Freq: Three times a day (TID) | ORAL | 1 refills | Status: DC

## 2018-04-12 NOTE — Telephone Encounter (Signed)
I tried to contact this patient to inform him of Dr. Delight Ovens message but there was no answer. A message was left for him with her comments and the number to [facility] [phone]) in case he had questions about their walk-in ortho clinic.

## 2018-04-12 NOTE — Telephone Encounter (Signed)
Copied from [place] [id number]. Topic: Quick Communication - See Telephone Encounter >> Mar 26, 2018 12:47 PM Vernona Rieger wrote: CRM for notification. See Telephone encounter for: 03/26/18.  Patient said that Benjamine Mola told him at his visit on 5/8 that if he needed more anti inflammatory to just call and she would prescribe that since she already seen him for his back pain. Please advise. Patient did not know the name of the medication.   Call back @ [phone] [facility] [id number] [place] [address], [place] >> Apr 05, 2018 12:25 PM Pilar Grammes F wrote: Patient is still experiencing great pain.  Patient only have two muscle relaxers left and he is completley out of his anti inflamatories.  Please advise.  He said no one has called him back yet.

## 2018-04-12 NOTE — Telephone Encounter (Signed)
Please direct him to urgent care or to the [facility] walk-in clinic today

## 2018-06-04 ENCOUNTER — Ambulatory Visit (INDEPENDENT_AMBULATORY_CARE_PROVIDER_SITE_OTHER): Payer: PRIVATE HEALTH INSURANCE | Admitting: Family Medicine

## 2018-06-04 ENCOUNTER — Encounter: Payer: Self-pay | Admitting: Family Medicine

## 2018-06-04 DIAGNOSIS — E669 Obesity, unspecified: Secondary | ICD-10-CM

## 2018-06-04 DIAGNOSIS — Z0000 Encounter for general adult medical examination without abnormal findings: Secondary | ICD-10-CM

## 2018-06-04 NOTE — Progress Notes (Signed)
Patient ID: Chris Baldwin, male   DOB: [birth year], 42 y.o.   MRN: [id number]   Subjective:   Chris Baldwin is a 42 y.o. male here for a complete physical exam  Interim issues since last visit: he has a ruptured disc, battling it for 3 months and going to have diskectomy; it is worker's comp  USPSTF grade A and B recommendations Depression:  Depression screen [facility] 2/9 06/04/2018 03/01/2018 11/02/2017 09/11/2017 06/01/2017  Decreased Interest 0 0 0 0 0  Down, Depressed, Hopeless 0 0 0 0 0  PHQ - 2 Score 0 0 0 0 0   Hypertension: BP Readings from Last 3 Encounters:  06/04/18 132/82  04/11/18 130/83  03/14/18 136/74   Obesity: down 7.8 pounds since May; he is starting to gain back though Wt Readings from Last 3 Encounters:  06/04/18 297 lb 6.4 oz (134.9 kg)  04/11/18 300 lb (136.1 kg)  03/14/18 (!) 304 lb 14.4 oz (138.3 kg)   BMI Readings from Last 3 Encounters:  06/04/18 36.20 kg/m  04/11/18 36.52 kg/m  03/14/18 36.16 kg/m    Immunizations: hep A/B, tetanus UTD Skin cancer: no worrisome moles; sees derm every few years; he does not need referral for this Lung cancer:  Nonsmoker now; might smoke one or two every four or five months; quit for real five years ago Prostate cancer: saw urologist not long ago Lab Results  Component Value Date   PSA 0.7 06/01/2017   PSA 0.7 07/05/2016   Colorectal cancer: mother had colon cancer diagnosed around 63 years; already had one, getting every five years; two polyps; no blood in the stool AAA: n/a Aspirin: n/a Diet: typical american eater, varies it up; hates salt, pepper person Exercise: usually pretty active, but not recently with the back; cannot push or pull, squat kneel climb; no other back issues in the family Alcohol: less than 14 per week Tobacco use: see above HIV, hep B, hep C: not interested STD testing and prevention (chl/gon/syphilis): not interested Glucose:  Glucose  Date Value Ref Range Status   02/15/2016 95 65 - 99 mg/dL Final  01/11/2013 100 (H) 65 - 99 mg/dL Final   Glucose, Bld  Date Value Ref Range Status  03/01/2018 98 65 - 99 mg/dL Final    Comment:    .            Fasting reference interval .    Lipids:  Lab Results  Component Value Date   CHOL 192 03/01/2018   CHOL 177 07/05/2016   CHOL 190 02/15/2016   Lab Results  Component Value Date   HDL 35 (L) 03/01/2018   HDL 29 (L) 07/05/2016   HDL 31 (L) 02/15/2016   Lab Results  Component Value Date   LDLCALC 121 (H) 03/01/2018   LDLCALC 106 07/05/2016   LDLCALC 121 (H) 02/15/2016   Lab Results  Component Value Date   TRIG 251 (H) 03/01/2018   TRIG 208 (H) 07/05/2016   TRIG 188 (H) 02/15/2016   Lab Results  Component Value Date   CHOLHDL 5.5 (H) 03/01/2018   CHOLHDL 6.1 (H) 07/05/2016   CHOLHDL 6.1 (H) 02/15/2016   No results found for: LDLDIRECT   Past Medical History:  Diagnosis Date  . Allergy   . GERD (gastroesophageal reflux disease)   . Sleep apnea    Past Surgical History:  Procedure Laterality Date  . APPENDECTOMY     in young adult years  . COLONOSCOPY WITH PROPOFOL  N/A 04/27/2016   Procedure: COLONOSCOPY WITH PROPOFOL;  Surgeon: Manya Silvas, MD;  Location: [facility] ENDOSCOPY;  Service: Endoscopy;  Laterality: N/A;  . SURGERY SCROTAL / TESTICULAR     young adult years  . TONSILLECTOMY Bilateral    in childhood   Family History  Problem Relation Age of Onset  . Heart disease Mother   . Diabetes Mother   . Cancer Mother        colon  . Arthritis Mother   . Heart disease Father   . Arthritis Father    Social History   Tobacco Use  . Smoking status: Former Smoker    Years: 1.00    Types: Cigarettes  . Smokeless tobacco: Never Used  Substance Use Topics  . Alcohol use: Yes    Alcohol/week: 0.0 - 0.6 oz    Comment: occasional  . Drug use: No   Review of Systems  Objective:   Vitals:   06/04/18 1531  BP: 132/82  Pulse: 86  Resp: 14  Temp: 98.4 F (36.9 C)   TempSrc: Oral  SpO2: 94%  Weight: 297 lb 6.4 oz (134.9 kg)  Height: 6\' 4"  (1.93 m)   Body mass index is 36.2 kg/m. Wt Readings from Last 3 Encounters:  06/04/18 297 lb 6.4 oz (134.9 kg)  04/11/18 300 lb (136.1 kg)  03/14/18 (!) 304 lb 14.4 oz (138.3 kg)   Physical Exam  Constitutional: He appears well-developed and well-nourished. No distress.  HENT:  Head: Normocephalic and atraumatic.  Nose: Nose normal.  Mouth/Throat: Oropharynx is clear and moist.  Eyes: EOM are normal. No scleral icterus.  Neck: No JVD present. No thyromegaly present.  Cardiovascular: Normal rate, regular rhythm and normal heart sounds.  Pulmonary/Chest: Effort normal and breath sounds normal. No respiratory distress. He has no wheezes. He has no rales.  Abdominal: Soft. Bowel sounds are normal. He exhibits no distension. There is no tenderness. There is no guarding.  Musculoskeletal: Normal range of motion. He exhibits no edema.  Lymphadenopathy:    He has no cervical adenopathy.  Neurological: He is alert. He displays normal reflexes. He exhibits normal muscle tone. Coordination normal.  Skin: Skin is warm and dry. No rash noted. He is not diaphoretic. No erythema. No pallor.  Psychiatric: He has a normal mood and affect. His behavior is normal. Judgment and thought content normal.    Assessment/Plan:   Problem List Items Addressed This Visit      Other   Obesity (BMI 35.0-39.9 without comorbidity)    Encouraged weight loss      Annual physical exam    USPSTF grade A and B recommendations reviewed with patient; age-appropriate recommendations, preventive care, screening tests, etc discussed and encouraged; healthy living encouraged; see AVS for patient education given to patient           No orders of the defined types were placed in this encounter.  No orders of the defined types were placed in this encounter.   Follow up plan: Return in about 1 year (around 06/05/2019) for complete  physical.  An After Visit Summary was printed and given to the patient.

## 2018-06-04 NOTE — Patient Instructions (Addendum)
Check out the information at familydoctor.org entitled "Nutrition for Weight Loss: What You Need to Know about Fad Diets" Try to lose between 1-2 pounds per week by taking in fewer calories and burning off more calories You can succeed by limiting portions, limiting foods dense in calories and fat, becoming more active, and drinking 8 glasses of water a day (64 ounces) Don't skip meals, especially breakfast, as skipping meals may alter your metabolism Do not use over-the-counter weight loss pills or gimmicks that claim rapid weight loss A healthy BMI (or body mass index) is between 18.5 and 24.9 You can calculate your ideal BMI at the NIH website http://www.nhlbi.nih.gov/health/educational/lose_wt/BMI/bmicalc.htm   Preventing Unhealthy Weight Gain, Adult Staying at a healthy weight is important. When fat builds up in your body, you may become overweight or obese. These conditions put you at greater risk for developing certain health problems, such as heart disease, diabetes, sleeping problems, joint problems, and some cancers. Unhealthy weight gain is often the result of making unhealthy choices in what you eat. It is also a result of not getting enough exercise. You can make changes to your lifestyle to prevent obesity and stay as healthy as possible. What nutrition changes can be made? To maintain a healthy weight and prevent obesity:  Eat only as much as your body needs. To do this: ? Pay attention to signs that you are hungry or full. Stop eating as soon as you feel full. ? If you feel hungry, try drinking water first. Drink enough water so your urine is clear or pale yellow. ? Eat smaller portions. ? Look at serving sizes on food labels. Most foods contain more than one serving per container. ? Eat the recommended amount of calories for your gender and activity level. While most active people should eat around 2,000 calories per day, if you are trying to lose weight or are not very active,  you main need to eat less calories. Talk to your health care provider or dietitian about how many calories you should eat each day.  Choose healthy foods, such as: ? Fruits and vegetables. Try to fill at least half of your plate at each meal with fruits and vegetables. ? Whole grains, such as whole wheat bread, brown rice, and quinoa. ? Lean meats, such as chicken or fish. ? Other healthy proteins, such as beans, eggs, or tofu. ? Healthy fats, such as nuts, seeds, fatty fish, and olive oil. ? Low-fat or fat-free dairy.  Check food labels and avoid food and drinks that: ? Are high in calories. ? Have added sugar. ? Are high in sodium. ? Have saturated fats or trans fats.  Limit how much you eat of the following foods: ? Prepackaged meals. ? Fast food. ? Fried foods. ? Processed meat, such as bacon, sausage, and deli meats. ? Fatty cuts of red meat and poultry with skin.  Cook foods in healthier ways, such as by baking, broiling, or grilling.  When grocery shopping, try to shop around the outside of the store. This helps you buy mostly fresh foods and avoid canned and prepackaged foods.  What lifestyle changes can be made?  Exercise at least 30 minutes 5 or more days each week. Exercising includes brisk walking, yard work, biking, running, swimming, and team sports like basketball and soccer. Ask your health care provider which exercises are safe for you.  Do not use any products that contain nicotine or tobacco, such as cigarettes and e-cigarettes. If you need help quitting,   your health care provider.  Limit alcohol intake to no more than 1 drink a day for nonpregnant women and 2 drinks a day for men. One drink equals 12 oz of beer, 5 oz of wine, or 1 oz of hard liquor.  Try to get 7-9 hours of sleep each night. What other changes can be made?  Keep a food and activity journal to keep track of: ? What you ate and how many calories you had. Remember to count sauces, dressings,  and side dishes. ? Whether you were active, and what exercises you did. ? Your calorie, weight, and activity goals.  Check your weight regularly. Track any changes. If you notice you have gained weight, make changes to your diet or activity routine.  Avoid taking weight-loss medicines or supplements. Talk to your health care provider before starting any new medicine or supplement.  Talk to your health care provider before trying any new diet or exercise plan. Why are these changes important? Eating healthy, staying active, and having healthy habits not only help prevent obesity, they also:  Help you to manage stress and emotions.  Help you to connect with friends and family.  Improve your self-esteem.  Improve your sleep.  Prevent long-term health problems.  What can happen if changes are not made? Being obese or overweight can cause you to develop joint or bone problems, which can make it hard for you to stay active or do activities you enjoy. Being obese or overweight also puts stress on your heart and lungs and can lead to health problems like diabetes, heart disease, and some cancers. Where to find more information: Talk with your health care provider or a dietitian about healthy eating and healthy lifestyle choices. You may also find other information through these resources:  [place]. Department of Agriculture MyPlate: FormerBoss.no  American Heart Association: www.heart.org  Centers for Disease Control and Prevention: http://www.wolf.info/  Summary  Staying at a healthy weight is important. It helps prevent certain diseases and health problems, such as heart disease, diabetes, joint problems, sleep disorders, and some cancers.  Being obese or overweight can cause you to develop joint or bone problems, which can make it hard for you to stay active or do activities you enjoy.  You can prevent unhealthy weight gain by eating a healthy diet, exercising regularly, not smoking,  limiting alcohol, and getting enough sleep.  Talk with your health care provider or a dietitian for guidance about healthy eating and healthy lifestyle choices. This information is not intended to replace advice given to you by your health care provider. Make sure you discuss any questions you have with your health care provider. Document Released: 10/25/2016 Document Revised: 11/30/2016 Document Reviewed: 11/30/2016 Elsevier Interactive Patient Education  2018 [place] Maintenance, Male A healthy lifestyle and preventive care is important for your health and wellness. Ask your health care provider about what schedule of regular examinations is right for you. What should I know about weight and diet? Eat a Healthy Diet  Eat plenty of vegetables, fruits, whole grains, low-fat dairy products, and lean protein.  Do not eat a lot of foods high in solid fats, added sugars, or salt.  Maintain a Healthy Weight Regular exercise can help you achieve or maintain a healthy weight. You should:  Do at least 150 minutes of exercise each week. The exercise should increase your heart rate and make you sweat (moderate-intensity exercise).  Do strength-training exercises at least twice a week.  Watch Your  Levels of Cholesterol and Blood Lipids  Have your blood tested for lipids and cholesterol every 5 years starting at 42 years of age. If you are at high risk for heart disease, you should start having your blood tested when you are 42 years old. You may need to have your cholesterol levels checked more often if: ? Your lipid or cholesterol levels are high. ? You are older than 42 years of age. ? You are at high risk for heart disease.  What should I know about cancer screening? Many types of cancers can be detected early and may often be prevented. Lung Cancer  You should be screened every year for lung cancer if: ? You are a current smoker who has smoked for at least 30 years. ? You  are a former smoker who has quit within the past 15 years.  Talk to your health care provider about your screening options, when you should start screening, and how often you should be screened.  Colorectal Cancer  Routine colorectal cancer screening usually begins at 42 years of age and should be repeated every 5-10 years until you are 42 years old. You may need to be screened more often if early forms of precancerous polyps or small growths are found. Your health care provider may recommend screening at an earlier age if you have risk factors for colon cancer.  Your health care provider may recommend using home test kits to check for hidden blood in the stool.  A small camera at the end of a tube can be used to examine your colon (sigmoidoscopy or colonoscopy). This checks for the earliest forms of colorectal cancer.  Prostate and Testicular Cancer  Depending on your age and overall health, your health care provider may do certain tests to screen for prostate and testicular cancer.  Talk to your health care provider about any symptoms or concerns you have about testicular or prostate cancer.  Skin Cancer  Check your skin from head to toe regularly.  Tell your health care provider about any new moles or changes in moles, especially if: ? There is a change in a mole's size, shape, or color. ? You have a mole that is larger than a pencil eraser.  Always use sunscreen. Apply sunscreen liberally and repeat throughout the day.  Protect yourself by wearing long sleeves, pants, a wide-brimmed hat, and sunglasses when outside.  What should I know about heart disease, diabetes, and high blood pressure?  If you are 22-22 years of age, have your blood pressure checked every 3-5 years. If you are 58 years of age or older, have your blood pressure checked every year. You should have your blood pressure measured twice-once when you are at a hospital or clinic, and once when you are not at a  hospital or clinic. Record the average of the two measurements. To check your blood pressure when you are not at a hospital or clinic, you can use: ? An automated blood pressure machine at a pharmacy. ? A home blood pressure monitor.  Talk to your health care provider about your target blood pressure.  If you are between 62-77 years old, ask your health care provider if you should take aspirin to prevent heart disease.  Have regular diabetes screenings by checking your fasting blood sugar level. ? If you are at a normal weight and have a low risk for diabetes, have this test once every three years after the age of 18. ? If you are overweight  and have a high risk for diabetes, consider being tested at a younger age or more often.  A one-time screening for abdominal aortic aneurysm (AAA) by ultrasound is recommended for men aged 66-75 years who are current or former smokers. What should I know about preventing infection? Hepatitis B If you have a higher risk for hepatitis B, you should be screened for this virus. Talk with your health care provider to find out if you are at risk for hepatitis B infection. Hepatitis C Blood testing is recommended for:  Everyone born from [birth year] through 1965.  Anyone with known risk factors for hepatitis C.  Sexually Transmitted Diseases (STDs)  You should be screened each year for STDs including gonorrhea and chlamydia if: ? You are sexually active and are younger than 42 years of age. ? You are older than 42 years of age and your health care provider tells you that you are at risk for this type of infection. ? Your sexual activity has changed since you were last screened and you are at an increased risk for chlamydia or gonorrhea. Ask your health care provider if you are at risk.  Talk with your health care provider about whether you are at high risk of being infected with HIV. Your health care provider may recommend a prescription medicine to help prevent  HIV infection.  What else can I do?  Schedule regular health, dental, and eye exams.  Stay current with your vaccines (immunizations).  Do not use any tobacco products, such as cigarettes, chewing tobacco, and e-cigarettes. If you need help quitting, ask your health care provider.  Limit alcohol intake to no more than 2 drinks per day. One drink equals 12 ounces of beer, 5 ounces of wine, or 1 ounces of hard liquor.  Do not use street drugs.  Do not share needles.  Ask your health care provider for help if you need support or information about quitting drugs.  Tell your health care provider if you often feel depressed.  Tell your health care provider if you have ever been abused or do not feel safe at home. This information is not intended to replace advice given to you by your health care provider. Make sure you discuss any questions you have with your health care provider. Document Released: 04/21/2008 Document Revised: 06/22/2016 Document Reviewed: 07/28/2015 Elsevier Interactive Patient Education  [employer].

## 2018-06-12 NOTE — Assessment & Plan Note (Signed)
USPSTF grade A and B recommendations reviewed with patient; age-appropriate recommendations, preventive care, screening tests, etc discussed and encouraged; healthy living encouraged; see AVS for patient education given to patient  

## 2018-06-12 NOTE — Assessment & Plan Note (Signed)
Encouraged weight loss 

## 2018-06-18 ENCOUNTER — Other Ambulatory Visit: Payer: No Typology Code available for payment source

## 2018-10-01 ENCOUNTER — Other Ambulatory Visit: Payer: No Typology Code available for payment source

## 2018-10-08 ENCOUNTER — Ambulatory Visit: Admit: 2018-10-08 | Payer: No Typology Code available for payment source | Admitting: Otolaryngology

## 2018-10-08 SURGERY — SEPTOPLASTY, NOSE, WITH NASAL TURBINATE REDUCTION
Anesthesia: General | Laterality: Bilateral

## 2018-12-24 ENCOUNTER — Encounter: Payer: Self-pay | Admitting: Nurse Practitioner

## 2018-12-24 ENCOUNTER — Ambulatory Visit: Payer: PRIVATE HEALTH INSURANCE | Admitting: Nurse Practitioner

## 2018-12-24 VITALS — BP 112/74 | HR 71 | Temp 98.1°F | Resp 16 | Ht 76.0 in | Wt 310.7 lb

## 2018-12-24 DIAGNOSIS — R002 Palpitations: Secondary | ICD-10-CM | POA: Diagnosis not present

## 2018-12-24 DIAGNOSIS — R5383 Other fatigue: Secondary | ICD-10-CM

## 2018-12-24 DIAGNOSIS — R438 Other disturbances of smell and taste: Secondary | ICD-10-CM

## 2018-12-24 DIAGNOSIS — E559 Vitamin D deficiency, unspecified: Secondary | ICD-10-CM

## 2018-12-24 DIAGNOSIS — R454 Irritability and anger: Secondary | ICD-10-CM

## 2018-12-24 DIAGNOSIS — R413 Other amnesia: Secondary | ICD-10-CM

## 2018-12-24 DIAGNOSIS — E291 Testicular hypofunction: Secondary | ICD-10-CM

## 2018-12-24 DIAGNOSIS — F411 Generalized anxiety disorder: Secondary | ICD-10-CM

## 2018-12-24 NOTE — Patient Instructions (Addendum)
Patient currently 64 ounces of water a day.  Please limit caffeinated beverages to one 8 ounce beverage of caffeine.  You may want to slowly cut down to prevent rebound caffeine headache.  Please follow-up appointment with dentist to evaluate for causes of metallic taste in the mouth. Please call your ENT doctor to further evaluate, you can also wait till you see the dentist if you would like.  You should hear back about lab work within 1 week.  Please call us if you do not.  We will follow-up with you in 1 month, but we are here sooner if needed.  We have sent referrals to neurology and psychiatry you should hear back within 1 month, but most likely sooner.  Please call and let us know if you do not hear back about these referrals.   12 Ways to Curb Anxiety  ?Anxiety is normal human sensation. It is what helped our ancestors survive the pitfalls of the wilderness. Anxiety is defined as experiencing worry or nervousness about an imminent event or something with an uncertain outcome. It is a feeling experienced by most people at some point in their lives. Anxiety can be triggered by a very personal issue, such as the illness of a loved one, or an event of global proportions, such as a refugee crisis. Some of the symptoms of anxiety are:  Feeling restless.  Having a feeling of impending danger.  Increased heart rate.  Rapid breathing. Sweating.  Shaking.  Weakness or feeling tired.  Difficulty concentrating on anything except the current worry.  Insomnia.  Stomach or bowel problems. What can we do about anxiety we may be feeling? There are many techniques to help manage stress and relax. Here are 12 ways you can reduce your anxiety almost immediately: 1. Turn off the constant feed of information. Take a social media sabbatical. Studies have shown that social media directly contributes to social anxiety.  2. Monitor your television viewing habits. Are you watching shows that are also contributing to  your anxiety, such as 24-hour news stations? Try watching something else, or better yet, nothing at all. Instead, listen to music, read an inspirational book or practice a hobby. 3. Eat nutritious meals. Also, don't skip meals and keep healthful snacks on hand. Hunger and poor diet contributes to feeling anxious. 4. Sleep. Sleeping on a regular schedule for at least seven to eight hours a night will do wonders for your outlook when you are awake. 5. Exercise. Regular exercise will help rid your body of that anxious energy and help you get more restful sleep. 6. Try deep (diaphragmatic) breathing. Inhale slowly through your nose for five seconds and exhale through your mouth. 7. Practice acceptance and gratitude. When anxiety hits, accept that there are things out of your control that shouldn't be of immediate concern.  8. Seek out humor. When anxiety strikes, watch a funny video, read jokes or call a friend who makes you laugh. Laughter is healing for our bodies and releases endorphins that are calming. 9. Stay positive. Take the effort to replace negative thoughts with positive ones. Try to see a stressful situation in a positive light. Try to come up with solutions rather than dwelling on the problem. 10. Figure out what triggers your anxiety. Keep a journal and make note of anxious moments and the events surrounding them. This will help you identify triggers you can avoid or even eliminate. 11. Talk to someone. Let a trusted friend, family member or even trained professional  know that you are feeling overwhelmed and anxious. Verbalize what you are feeling and why.  12. Volunteer. If your anxiety is triggered by a crisis on a large scale, become an advocate and work to resolve the problem that is causing you unease. Anxiety is often unwelcome and can become overwhelming. If not kept in check, it can become a disorder that could require medical treatment. However, if you take the time to care for  yourself and avoid the triggers that make you anxious, you will be able to find moments of relaxation and clarity that make your life much more enjoyable.

## 2018-12-24 NOTE — Progress Notes (Signed)
Name: Chris Baldwin   MRN: [id number]    DOB: 09/15/1976   Date:12/24/2018       Progress Note  Subjective  Chief Complaint  Chief Complaint  Patient presents with  . Fatigue    ongoing - was taking Vitamin D but has recently stopped. some sx subsided but has not gone completely  . Tachycardia  . Metallic taste in Mouth    started 2 months ago  . Brain Fog    HPI Patient endorses fatigue ongoing for the past 2 years but has had extreme fatigue the last 3 months. He is having trouble getting up a flight of steps which he normally can do at ease. States he stopped taking his vitamin D supplement stopped 2 weeks ago and states he felt good for the first few days and has progressively felt better a little bit better each day but still very fatigued overall. Patient averages 7-8 hours of sleep a night. Takes him a while to get to sleep but is improved since he has been using CPAP. States he wakes up 2-3 times at night- just lays there and goes back to sleep. He has had insomnia his whole life- has never tried anything OTC. States some night attributes it to anxiety but not all the time. Around 4 caffeinated beverages a day- diet pepsi, and coffee. States feels constantly fatigued. Patient has noted increased memory issues the last 2-3 weeks states he notices you cannot recall things he would normally be able to recall. States went to a clients house in the morning and could not recall their name in the evening, changed his schedule on Friday and couldn't recall why this morning- these are things he is very capable of remembering typically. Patient states during one of his fatigue episodes he noted bilateral blurry vision and palpitations- checked his heart rate when in bed and noted it was 115bpm. Patient drinks alcohol- socially- maybe twice a month. Notes he has high irritability but has been constant over the last year. Was prescribed xanax for anxiety by Dr. Manuella Ghazi- previous PCP but states didn't  really remember taking it because he does not like to be on medications. Does note overall increased anxiety for the past year or so. Notes he gets too concentrated on one thing that he misses other things- took an adderall once and was able to feel "mellowed out" and concentrate on tasks.   Patient endorses metallic taste- very salty in his mouth that has stated about 2 months ago states he is more aware of this taste when he is having memory issues. Sees Dr. Kathyrn Sheriff ENT- was going to have surgery for blockage in nasal passage but was unable to do to no PTO. Dentist- hasn't seen dentist in 28 years- wife is setting up an dental appointment. Not taking any multivitamins, supplements, antibiotics.  Denies halitosis, facial pain, jaw pain. Endorses nasal congestion-  GAD 7 : Generalized Anxiety Score 12/24/2018  Nervous, Anxious, on Edge 1  Control/stop worrying 1  Worry too much - different things 1  Trouble relaxing 2  Restless 2  Easily annoyed or irritable 3  Afraid - awful might happen 0  Total GAD 7 Score 10  Anxiety Difficulty Very difficult   6CIT Screen 12/24/2018  What Year? 0 points  What month? 0 points  What time? 0 points  Count back from 20 0 points  Months in reverse 2 points  Repeat phrase 0 points  Total Score 2   MMSE -  Mini Mental State Exam 12/24/2018  Orientation to time 5  Orientation to Place 5  Registration 3  Attention/ Calculation 5  Recall 1  Language- name 2 objects 2  Language- repeat 1  Language- follow 3 step command 3  Language- read & follow direction 1  Write a sentence 1  Copy design 1  Total score 28       Patient Active Problem List   Diagnosis Date Noted  . Obesity (BMI 35.0-39.9 without comorbidity) 03/01/2018  . Low HDL (under 40) 03/01/2018  . Fatty liver 03/01/2018  . Anxiety disorder 09/11/2017  . Vitamin D deficiency 07/05/2016  . Annual physical exam 02/15/2016  . Left sided chest pain 02/15/2016  . Obstructive sleep apnea  01/18/2016  . Low testosterone 08/25/2015  . Dyspnea 08/25/2015  . Fatigue 08/25/2015    Past Medical History:  Diagnosis Date  . Allergy   . GERD (gastroesophageal reflux disease)   . Sleep apnea     Past Surgical History:  Procedure Laterality Date  . APPENDECTOMY     in young adult years  . COLONOSCOPY WITH PROPOFOL N/A 04/27/2016   Procedure: COLONOSCOPY WITH PROPOFOL;  Surgeon: Manya Silvas, MD;  Location: [facility] ENDOSCOPY;  Service: Endoscopy;  Laterality: N/A;  . SURGERY SCROTAL / TESTICULAR     young adult years  . TONSILLECTOMY Bilateral    in childhood    Social History   Tobacco Use  . Smoking status: Former Smoker    Years: 1.00    Types: Cigarettes  . Smokeless tobacco: Never Used  Substance Use Topics  . Alcohol use: Yes    Alcohol/week: 0.0 - 1.0 standard drinks    Comment: occasional     Current Outpatient Medications:  .  fluticasone (FLONASE) 50 MCG/ACT nasal spray, Place 2 sprays into both nostrils daily., Disp: 16 g, Rfl: 6 .  cetirizine (ZYRTEC) 10 MG tablet, Take 10 mg daily by mouth., Disp: , Rfl:  .  cyclobenzaprine (FLEXERIL) 10 MG tablet, Take 10 mg by mouth 2 (two) times daily., Disp: , Rfl: 0 .  NARCAN 4 MG/0.1ML LIQD nasal spray kit, TAKE 1 SPRAY(S) BY NASAL ROUTE, Disp: , Rfl: 0 .  traMADol (ULTRAM) 50 MG tablet, Take 50 mg by mouth every 8 (eight) hours as needed., Disp: , Rfl:   Allergies  Allergen Reactions  . Nexium [Esomeprazole Magnesium] Other (See Comments)    Brand name Brand name Brand name Brand name Brand name    Review of Systems  Constitutional: Positive for malaise/fatigue. Negative for chills, fever and weight loss.  HENT: Positive for congestion. Negative for ear discharge, ear pain, sinus pain, sore throat and tinnitus.   Eyes: Positive for blurred vision. Negative for double vision and pain.  Respiratory: Negative for hemoptysis, sputum production, shortness of breath and wheezing.   Cardiovascular:  Positive for palpitations. Negative for chest pain, orthopnea and leg swelling.  Gastrointestinal: Negative for abdominal pain, constipation, diarrhea, nausea and vomiting.  Genitourinary: Negative for dysuria and flank pain.  Musculoskeletal: Negative for falls and myalgias.  Skin: Negative for rash.  Neurological: Positive for tingling (endorses chronic paresthesis in his hands), weakness and headaches. Negative for dizziness, sensory change, speech change and focal weakness.  Psychiatric/Behavioral: Negative for depression, substance abuse and suicidal ideas. The patient is nervous/anxious and has insomnia.      No other specific complaints in a complete review of systems (except as listed in HPI above).  Objective  Vitals:   12/24/18 4174  BP: 112/74  Pulse: 71  Resp: 16  Temp: 98.1 F (36.7 C)  TempSrc: Oral  SpO2: 95%  Weight: (!) 310 lb 11.2 oz (140.9 kg)  Height: '6\' 4"'$  (1.93 m)    Body mass index is 37.82 kg/m.  Nursing Note and Vital Signs reviewed.  Physical Exam Vitals signs reviewed.  Constitutional:      General: He is not in acute distress.    Appearance: He is well-developed. He is not ill-appearing.  HENT:     Head: Normocephalic and atraumatic.     Right Ear: There is no impacted cerumen.     Left Ear: There is impacted cerumen.     Nose: Congestion present.     Mouth/Throat:     Dentition: Abnormal dentition.     Pharynx: No oropharyngeal exudate or posterior oropharyngeal erythema.  Eyes:     Extraocular Movements: Extraocular movements intact.     Conjunctiva/sclera: Conjunctivae normal.     Pupils: Pupils are equal, round, and reactive to light.  Neck:     Musculoskeletal: Normal range of motion and neck supple.     Vascular: No carotid bruit.  Cardiovascular:     Pulses: Normal pulses.     Heart sounds: Normal heart sounds.  Pulmonary:     Effort: Pulmonary effort is normal.     Breath sounds: Normal breath sounds.  Abdominal:      General: Bowel sounds are normal.     Palpations: Abdomen is soft.     Tenderness: There is no abdominal tenderness.  Musculoskeletal: Normal range of motion.  Skin:    General: Skin is warm and dry.     Capillary Refill: Capillary refill takes less than 2 seconds.  Neurological:     Mental Status: He is alert and oriented to person, place, and time.     GCS: GCS eye subscore is 4. GCS verbal subscore is 5. GCS motor subscore is 6.     Cranial Nerves: No cranial nerve deficit or dysarthria.     Sensory: No sensory deficit.     Motor: No weakness.     Coordination: Coordination normal.     Gait: Gait is intact.  Psychiatric:        Speech: Speech normal.        Behavior: Behavior normal.        Thought Content: Thought content normal.        Judgment: Judgment normal.      No results found for this or any previous visit (from the past 48 hour(s)).  Assessment & Plan  1. Fatigue, unspecified type Decrease caffeine, increase water, discussed sleep hygiene, offered SNRI to help with anxiety and concentration- declined at this time; would like to start with psychiatry.  Patient has multiple comorbidities that could be causing fatigue- sleep apnea, excessive caffeine, insomnia, vitamin D deficiency, generalized anxiety disorder, poor diet, hypogonadism, work schedule.  Discussed doing some routine blood work initially in addition to lifestyle modification.  Due to memory changes will refer to neurology.  Referral to psychiatry per patient request.  Patient will follow-up with dentistry and ENT for metallic taste and smell.  Follow-up here in 1 month - TSH - CBC w/Diff/Platelet - COMPLETE METABOLIC PANEL WITH GFR - Vitamin D (25 hydroxy) - Testosterone , Free and Total  2. Hypogonadism in male Stopped OTC testosterone a few months ago - Testosterone , Free and Total  3. Vitamin D deficiency Stopped supplementation 2 weeks ago - Vitamin D (25  hydroxy)  4. Palpitations Normal  sinus, no ectopy, rate 69bpm.  - EKG 12-Lead  5. Memory changes - Ambulatory referral to Neurology - Ambulatory referral to Psychiatry  6. GAD (generalized anxiety disorder) Patient states he is willing to go to psychiatry- would like to talk to them first before starting any medication regimens.  - Ambulatory referral to Psychiatry  7. Irritability - Ambulatory referral to Psychiatry  8. Metallic taste See dentist and ENT

## 2018-12-27 LAB — CBC WITH DIFFERENTIAL/PLATELET
Absolute Monocytes: 436 cells/uL (ref 200–950)
Basophils Absolute: 52 cells/uL (ref 0–200)
Basophils Relative: 0.8 %
Eosinophils Absolute: 247 cells/uL (ref 15–500)
Eosinophils Relative: 3.8 %
HCT: 43.7 % (ref 38.5–50.0)
HEMOGLOBIN: 15 g/dL (ref 13.2–17.1)
Lymphs Abs: 2366 cells/uL (ref 850–3900)
MCH: 30.5 pg (ref 27.0–33.0)
MCHC: 34.3 g/dL (ref 32.0–36.0)
MCV: 89 fL (ref 80.0–100.0)
MPV: 11.8 fL (ref 7.5–12.5)
Monocytes Relative: 6.7 %
Neutro Abs: 3400 cells/uL (ref 1500–7800)
Neutrophils Relative %: 52.3 %
Platelets: 214 10*3/uL (ref 140–400)
RBC: 4.91 10*6/uL (ref 4.20–5.80)
RDW: 12.8 % (ref 11.0–15.0)
Total Lymphocyte: 36.4 %
WBC: 6.5 10*3/uL (ref 3.8–10.8)

## 2018-12-27 LAB — COMPLETE METABOLIC PANEL WITH GFR
AG Ratio: 1.7 (calc) (ref 1.0–2.5)
ALBUMIN MSPROF: 4.7 g/dL (ref 3.6–5.1)
ALT: 31 U/L (ref 9–46)
AST: 19 U/L (ref 10–40)
Alkaline phosphatase (APISO): 51 U/L (ref 36–130)
BUN: 15 mg/dL (ref 7–25)
CO2: 27 mmol/L (ref 20–32)
Calcium: 10.1 mg/dL (ref 8.6–10.3)
Chloride: 106 mmol/L (ref 98–110)
Creat: 1.01 mg/dL (ref 0.60–1.35)
GFR, EST AFRICAN AMERICAN: 106 mL/min/{1.73_m2} (ref >=60)
GFR, Est Non African American: 91 mL/min/{1.73_m2} (ref >=60)
GLOBULIN: 2.7 g/dL (ref 1.9–3.7)
Glucose, Bld: 91 mg/dL (ref 65–99)
Potassium: 4.4 mmol/L (ref 3.5–5.3)
Sodium: 141 mmol/L (ref 135–146)
Total Bilirubin: 0.4 mg/dL (ref 0.2–1.2)
Total Protein: 7.4 g/dL (ref 6.1–8.1)

## 2018-12-27 LAB — TESTOSTERONE, FREE & TOTAL
Free Testosterone: 46.2 pg/mL (ref 35.0–155.0)
Testosterone, Total, LC-MS-MS: 294 ng/dL (ref 250–1100)

## 2018-12-27 LAB — TSH: TSH: 1.16 mIU/L (ref 0.40–4.50)

## 2018-12-27 LAB — VITAMIN D 25 HYDROXY (VIT D DEFICIENCY, FRACTURES): Vit D, 25-Hydroxy: 28 ng/mL — ABNORMAL LOW (ref 30–100)

## 2019-01-21 DIAGNOSIS — G4700 Insomnia, unspecified: Secondary | ICD-10-CM | POA: Insufficient documentation

## 2019-01-21 DIAGNOSIS — G479 Sleep disorder, unspecified: Secondary | ICD-10-CM

## 2019-01-21 DIAGNOSIS — R413 Other amnesia: Secondary | ICD-10-CM | POA: Insufficient documentation

## 2019-01-22 ENCOUNTER — Ambulatory Visit: Payer: PRIVATE HEALTH INSURANCE | Admitting: Family Medicine

## 2019-02-14 ENCOUNTER — Ambulatory Visit: Payer: PRIVATE HEALTH INSURANCE | Admitting: Family Medicine

## 2019-02-20 ENCOUNTER — Other Ambulatory Visit: Payer: Self-pay

## 2019-02-20 ENCOUNTER — Encounter: Payer: Self-pay | Admitting: Psychiatry

## 2019-02-20 ENCOUNTER — Ambulatory Visit (INDEPENDENT_AMBULATORY_CARE_PROVIDER_SITE_OTHER): Payer: Self-pay | Admitting: Psychiatry

## 2019-02-20 DIAGNOSIS — F5105 Insomnia due to other mental disorder: Secondary | ICD-10-CM

## 2019-02-20 DIAGNOSIS — R059 Cough, unspecified: Secondary | ICD-10-CM | POA: Insufficient documentation

## 2019-02-20 DIAGNOSIS — F411 Generalized anxiety disorder: Secondary | ICD-10-CM

## 2019-02-20 DIAGNOSIS — J209 Acute bronchitis, unspecified: Secondary | ICD-10-CM | POA: Insufficient documentation

## 2019-02-20 DIAGNOSIS — R05 Cough: Secondary | ICD-10-CM | POA: Insufficient documentation

## 2019-02-20 MED ORDER — MIRTAZAPINE 7.5 MG PO TABS: 7.5000 mg | ORAL_TABLET | Freq: Every day | ORAL | 1 refills | Status: DC

## 2019-02-20 NOTE — Patient Instructions (Signed)
Mirtazapine tablets  What is this medicine?  MIRTAZAPINE (mir TAZ a peen) is used to treat depression.  This medicine may be used for other purposes; ask your health care provider or pharmacist if you have questions.  COMMON BRAND NAME(S): Remeron  What should I tell my health care provider before I take this medicine?  They need to know if you have any of these conditions:  -bipolar disorder  -glaucoma  -kidney disease  -liver disease  -suicidal thoughts  -an unusual or allergic reaction to mirtazapine, other medicines, foods, dyes, or preservatives  -pregnant or trying to get pregnant  -breast-feeding  How should I use this medicine?  Take this medicine by mouth with a glass of water. Follow the directions on the prescription label. Take your medicine at regular intervals. Do not take your medicine more often than directed. Do not stop taking this medicine suddenly except upon the advice of your doctor. Stopping this medicine too quickly may cause serious side effects or your condition may worsen.  A special MedGuide will be given to you by the pharmacist with each prescription and refill. Be sure to read this information carefully each time.  Talk to your pediatrician regarding the use of this medicine in children. Special care may be needed.  Overdosage: If you think you have taken too much of this medicine contact a poison control center or emergency room at once.  NOTE: This medicine is only for you. Do not share this medicine with others.  What if I miss a dose?  If you miss a dose, take it as soon as you can. If it is almost time for your next dose, take only that dose. Do not take double or extra doses.  What may interact with this medicine?  Do not take this medicine with any of the following medications:  -linezolid  -MAOIs like Carbex, Eldepryl, Marplan, Nardil, and Parnate  -methylene blue (injected into a vein)  This medicine may also interact with the following medications:  -alcohol  -antiviral  medicines for HIV or AIDS  -certain medicines that treat or prevent blood clots like warfarin  -certain medicines for depression, anxiety, or psychotic disturbances  -certain medicines for fungal infections like ketoconazole and itraconazole  -certain medicines for migraine headache like almotriptan, eletriptan, frovatriptan, naratriptan, rizatriptan, sumatriptan, zolmitriptan  -certain medicines for seizures like carbamazepine or phenytoin  -certain medicines for sleep  -cimetidine  -erythromycin  -fentanyl  -lithium  -medicines for blood pressure  -nefazodone  -rasagiline  -rifampin  -supplements like St. John's wort, kava kava, valerian  -tramadol  -tryptophan  This list may not describe all possible interactions. Give your health care provider a list of all the medicines, herbs, non-prescription drugs, or dietary supplements you use. Also tell them if you smoke, drink alcohol, or use illegal drugs. Some items may interact with your medicine.  What should I watch for while using this medicine?  Tell your doctor if your symptoms do not get better or if they get worse. Visit your doctor or health care professional for regular checks on your progress. Because it may take several weeks to see the full effects of this medicine, it is important to continue your treatment as prescribed by your doctor.  Patients and their families should watch out for new or worsening thoughts of suicide or depression. Also watch out for sudden changes in feelings such as feeling anxious, agitated, panicky, irritable, hostile, aggressive, impulsive, severely restless, overly excited and hyperactive, or not being   able to sleep. If this happens, especially at the beginning of treatment or after a change in dose, call your health care professional.  You may get drowsy or dizzy. Do not drive, use machinery, or do anything that needs mental alertness until you know how this medicine affects you. Do not stand or sit up quickly, especially if  you are an older patient. This reduces the risk of dizzy or fainting spells. Alcohol may interfere with the effect of this medicine. Avoid alcoholic drinks.  This medicine may cause dry eyes and blurred vision. If you wear contact lenses you may feel some discomfort. Lubricating drops may help. See your eye doctor if the problem does not go away or is severe.  Your mouth may get dry. Chewing sugarless gum or sucking hard candy, and drinking plenty of water may help. Contact your doctor if the problem does not go away or is severe.  What side effects may I notice from receiving this medicine?  Side effects that you should report to your doctor or health care professional as soon as possible:  -allergic reactions like skin rash, itching or hives, swelling of the face, lips, or tongue  -anxious  -changes in vision  -chest pain  -confusion  -elevated mood, decreased need for sleep, racing thoughts, impulsive behavior  -eye pain  -fast, irregular heartbeat  -feeling faint or lightheaded, falls  -feeling agitated, angry, or irritable  -fever or chills, sore throat  -hallucination, loss of contact with reality  -loss of balance or coordination  -mouth sores  -redness, blistering, peeling or loosening of the skin, including inside the mouth  -restlessness, pacing, inability to keep still  -seizures  -stiff muscles  -suicidal thoughts or other mood changes  -trouble passing urine or change in the amount of urine  -trouble sleeping  -unusual bleeding or bruising  -unusually weak or tired  -vomiting  Side effects that usually do not require medical attention (report to your doctor or health care professional if they continue or are bothersome):  -change in appetite  -constipation  -dizziness  -dry mouth  -muscle aches or pains  -nausea  -tired  -weight gain  This list may not describe all possible side effects. Call your doctor for medical advice about side effects. You may report side effects to FDA at 1-800-FDA-1088.  Where  should I keep my medicine?  Keep out of the reach of children.  Store at room temperature between 15 and 30 degrees C (59 and 86 degrees F) Protect from light and moisture. Throw away any unused medicine after the expiration date.  NOTE: This sheet is a summary. It may not cover all possible information. If you have questions about this medicine, talk to your doctor, pharmacist, or health care provider.  © 2019 [employer] (2016-03-24 17:30:45)
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
## 2019-02-20 NOTE — Progress Notes (Signed)
TC  On 02-20-19 @ 8:28 pt medical and surgical hx section was completed/ updated. Pt allergies were reviewed with no changes. Pt medications and pharmacy were reviewed and updated. No vitals taken due to this is a phone visit.

## 2019-03-04 ENCOUNTER — Ambulatory Visit: Payer: Self-pay | Admitting: Licensed Clinical Social Worker

## 2019-03-14 ENCOUNTER — Ambulatory Visit: Payer: Self-pay | Admitting: Psychiatry

## 2019-03-14 ENCOUNTER — Other Ambulatory Visit: Payer: Self-pay | Admitting: Psychiatry

## 2019-03-14 DIAGNOSIS — F5105 Insomnia due to other mental disorder: Secondary | ICD-10-CM

## 2019-03-14 DIAGNOSIS — F411 Generalized anxiety disorder: Secondary | ICD-10-CM

## 2019-07-01 ENCOUNTER — Ambulatory Visit (INDEPENDENT_AMBULATORY_CARE_PROVIDER_SITE_OTHER): Payer: PRIVATE HEALTH INSURANCE | Admitting: Nurse Practitioner

## 2019-07-01 ENCOUNTER — Encounter: Payer: Self-pay | Admitting: Nurse Practitioner

## 2019-07-01 ENCOUNTER — Other Ambulatory Visit: Payer: Self-pay

## 2019-07-01 VITALS — BP 118/78 | HR 64 | Temp 96.6°F | Resp 14 | Ht 76.0 in | Wt 316.8 lb

## 2019-07-01 DIAGNOSIS — R6889 Other general symptoms and signs: Secondary | ICD-10-CM | POA: Diagnosis not present

## 2019-07-01 DIAGNOSIS — E291 Testicular hypofunction: Secondary | ICD-10-CM

## 2019-07-01 DIAGNOSIS — Z0000 Encounter for general adult medical examination without abnormal findings: Secondary | ICD-10-CM

## 2019-07-01 DIAGNOSIS — Z1159 Encounter for screening for other viral diseases: Secondary | ICD-10-CM | POA: Diagnosis not present

## 2019-07-01 DIAGNOSIS — Z23 Encounter for immunization: Secondary | ICD-10-CM | POA: Diagnosis not present

## 2019-07-01 NOTE — Patient Instructions (Addendum)
General recommendations: 150 minutes of physical activity weekly, eat two servings of fish weekly, eat one serving of tree nuts ( cashews, pistachios, pecans, almonds.Marland Kitchen) every other day, eat 6 servings of fruit/vegetables daily and drink plenty of water and avoid sweet beverages. Recommend at least 64 ounces of water daily.    Stay Safe in the [place] The majority of sun exposure occurs before age 43 and skin cancer can take 20 years or more to develop. Whether your sun bathing days are behind you or you still spend time pursuing the perfect tan, you should be concerned about skin cancer.  Remember, the sun's ultraviolet (UV) rays can reflect off water, sand, concrete and snow, and can reach below the water's surface. Certain types of UV light penetrate fog and clouds, so it's possible to get sunburn even on overcast days.  Avoid direct sunlight as much as possible during the peak sun hours, generally 10 a.m. to 3 p.m., or seek shade during this part of day. Wear broad-spectrum sunscreen - with an SPF of at least 30 - containing both UVA and UVB protection. Look for ingredients like [employer] (also known as avobenzone) or titanium dioxide on the label. Reapply sunscreen frequently, at least every two hours when outdoors, especially if you perspire or you've been swimming. Your best bet is to choose water-resistant products that are more likely to stay on your skin. Wear lip balm with an SPF 15 or higher. Wear a hat and other protective clothing while in the sun. Tightly woven fibers and darker clothing generally provide more protection. Also, look for products approved by the American Academy of Dermatology. Wear UV-protective sunglasses.  Good cholesterol, also called high-density lipoprotein (HDL) removes extra cholesterol and plaque buildup in your arteries and then sends it to your liver to get rid of and helps reduce your risk of heart disease, heart attack, and stroke.Foods that increase HDL:  beans and legumes, whole grains, high-fiber fruits:prunes, apples, and pears; fatty fish- salmon, tuna, sardines; nuts, olive oil   Calorie Counting for Weight Loss Calories are units of energy. Your body needs a certain amount of calories from food to keep you going throughout the day. When you eat more calories than your body needs, your body stores the extra calories as fat. When you eat fewer calories than your body needs, your body burns fat to get the energy it needs. Calorie counting means keeping track of how many calories you eat and drink each day. Calorie counting can be helpful if you need to lose weight. If you make sure to eat fewer calories than your body needs, you should lose weight. Ask your health care provider what a healthy weight is for you. For calorie counting to work, you will need to eat the right number of calories in a day in order to lose a healthy amount of weight per week. A dietitian can help you determine how many calories you need in a day and will give you suggestions on how to reach your calorie goal.  A healthy amount of weight to lose per week is usually 1-2 lb (0.5-0.9 kg). This usually means that your daily calorie intake should be reduced by 500-750 calories.  Eating 1,200 - 1,500 calories per day can help most women lose weight.  Eating 1,500 - 1,800 calories per day can help most men lose weight. What is my plan? My goal is to have __________ calories per day. If I have this many calories per day, I  should lose around __________ pounds per week. What do I need to know about calorie counting? In order to meet your daily calorie goal, you will need to:  Find out how many calories are in each food you would like to eat. Try to do this before you eat.  Decide how much of the food you plan to eat.  Write down what you ate and how many calories it had. Doing this is called keeping a food log. To successfully lose weight, it is important to balance calorie  counting with a healthy lifestyle that includes regular activity. Aim for 150 minutes of moderate exercise (such as walking) or 75 minutes of vigorous exercise (such as running) each week. Where do I find calorie information?  The number of calories in a food can be found on a Nutrition Facts label. If a food does not have a Nutrition Facts label, try to look up the calories online or ask your dietitian for help. Remember that calories are listed per serving. If you choose to have more than one serving of a food, you will have to multiply the calories per serving by the amount of servings you plan to eat. For example, the label on a package of bread might say that a serving size is 1 slice and that there are 90 calories in a serving. If you eat 1 slice, you will have eaten 90 calories. If you eat 2 slices, you will have eaten 180 calories. How do I keep a food log? Immediately after each meal, record the following information in your food log:  What you ate. Don't forget to include toppings, sauces, and other extras on the food.  How much you ate. This can be measured in cups, ounces, or number of items.  How many calories each food and drink had.  The total number of calories in the meal. Keep your food log near you, such as in a small notebook in your pocket, or use a mobile app or website. Some programs will calculate calories for you and show you how many calories you have left for the day to meet your goal. What are some calorie counting tips?  Use your calories on foods and drinks that will fill you up and not leave you hungry: ? Some examples of foods that fill you up are nuts and nut butters, vegetables, lean proteins, and high-fiber foods like whole grains. High-fiber foods are foods with more than 5 g fiber per serving. ? Drinks such as sodas, specialty coffee drinks, alcohol, and juices have a lot of calories, yet do not fill you up.  Eat nutritious foods and avoid empty calories.  Empty calories are calories you get from foods or beverages that do not have many vitamins or protein, such as candy, sweets, and soda. It is better to have a nutritious high-calorie food (such as an avocado) than a food with few nutrients (such as a bag of chips).  Know how many calories are in the foods you eat most often. This will help you calculate calorie counts faster.  Pay attention to calories in drinks. Low-calorie drinks include water and unsweetened drinks.  Pay attention to nutrition labels for "low fat" or "fat free" foods. These foods sometimes have the same amount of calories or more calories than the full fat versions. They also often have added sugar, starch, or salt, to make up for flavor that was removed with the fat.  Find a way of tracking calories that works  for you. Get creative. Try different apps or programs if writing down calories does not work for you. What are some portion control tips?  Know how many calories are in a serving. This will help you know how many servings of a certain food you can have.  Use a measuring cup to measure serving sizes. You could also try weighing out portions on a kitchen scale. With time, you will be able to estimate serving sizes for some foods.  Take some time to put servings of different foods on your favorite plates, bowls, and cups so you know what a serving looks like.  Try not to eat straight from a bag or box. Doing this can lead to overeating. Put the amount you would like to eat in a cup or on a plate to make sure you are eating the right portion.  Use smaller plates, glasses, and bowls to prevent overeating.  Try not to multitask (for example, watch TV or use your computer) while eating. If it is time to eat, sit down at a table and enjoy your food. This will help you to know when you are full. It will also help you to be aware of what you are eating and how much you are eating. What are tips for following this plan? Reading  food labels  Check the calorie count compared to the serving size. The serving size may be smaller than what you are used to eating.  Check the source of the calories. Make sure the food you are eating is high in vitamins and protein and low in saturated and trans fats. Shopping  Read nutrition labels while you shop. This will help you make healthy decisions before you decide to purchase your food.  Make a grocery list and stick to it. Cooking  Try to cook your favorite foods in a healthier way. For example, try baking instead of frying.  Use low-fat dairy products. Meal planning  Use more fruits and vegetables. Half of your plate should be fruits and vegetables.  Include lean proteins like poultry and fish. How do I count calories when eating out?  Ask for smaller portion sizes.  Consider sharing an entree and sides instead of getting your own entree.  If you get your own entree, eat only half. Ask for a box at the beginning of your meal and put the rest of your entree in it so you are not tempted to eat it.  If calories are listed on the menu, choose the lower calorie options.  Choose dishes that include vegetables, fruits, whole grains, low-fat dairy products, and lean protein.  Choose items that are boiled, broiled, grilled, or steamed. Stay away from items that are buttered, battered, fried, or served with cream sauce. Items labeled "crispy" are usually fried, unless stated otherwise.  Choose water, low-fat milk, unsweetened iced tea, or other drinks without added sugar. If you want an alcoholic beverage, choose a lower calorie option such as a glass of wine or light beer.  Ask for dressings, sauces, and syrups on the side. These are usually high in calories, so you should limit the amount you eat.  If you want a salad, choose a garden salad and ask for grilled meats. Avoid extra toppings like bacon, cheese, or fried items. Ask for the dressing on the side, or ask for olive  oil and vinegar or lemon to use as dressing.  Estimate how many servings of a food you are given. For example, a serving  of cooked rice is  cup or about the size of half a baseball. Knowing serving sizes will help you be aware of how much food you are eating at restaurants. The list below tells you how big or small some common portion sizes are based on everyday objects: ? 1 oz-4 stacked dice. ? 3 oz-1 deck of cards. ? 1 tsp-1 die. ? 1 Tbsp- a ping-pong ball. ? 2 Tbsp-1 ping-pong ball. ?  cup- baseball. ? 1 cup-1 baseball. Summary  Calorie counting means keeping track of how many calories you eat and drink each day. If you eat fewer calories than your body needs, you should lose weight.  A healthy amount of weight to lose per week is usually 1-2 lb (0.5-0.9 kg). This usually means reducing your daily calorie intake by 500-750 calories.  The number of calories in a food can be found on a Nutrition Facts label. If a food does not have a Nutrition Facts label, try to look up the calories online or ask your dietitian for help.  Use your calories on foods and drinks that will fill you up, and not on foods and drinks that will leave you hungry.  Use smaller plates, glasses, and bowls to prevent overeating. This information is not intended to replace advice given to you by your health care provider. Make sure you discuss any questions you have with your health care provider. Document Released: 10/24/2005 Document Revised: 07/13/2018 Document Reviewed: 09/23/2016 Elsevier Patient Education  2020 [employer].

## 2019-07-01 NOTE — Progress Notes (Signed)
Name: Chris Baldwin   MRN: [id number]    DOB: 08-22-1976   Date:07/01/2019       Progress Note  Subjective  Chief Complaint  Chief Complaint  Patient presents with  . Annual Exam  . Labs Only    abnormal labs at work    HPI  Patient presents for annual CPE.  USPSTF grade A and B recommendations:  Diet:  Vegetable servings a week: 7  Fruit servings a week: 2-3 lots of cantaloupe  Water intake: 3-4 bottles a day Protein source: meat- lots of fish (tuna, salmon), cutting down on red meats a few time a week  Snacks: honey buns, diet mountain dew, bars Exercise:  About a few hours a week   Depression: phq 9 is negative Depression screen [facility] 2/9 07/01/2019 12/24/2018 06/04/2018 03/01/2018 11/02/2017  Decreased Interest 0 0 0 0 0  Down, Depressed, Hopeless 0 1 0 0 0  PHQ - 2 Score 0 1 0 0 0  Altered sleeping 0 - - - -  Tired, decreased energy 0 - - - -  Change in appetite 0 - - - -  Feeling bad or failure about yourself  0 - - - -  Trouble concentrating 0 - - - -  Moving slowly or fidgety/restless 0 - - - -  Suicidal thoughts 0 - - - -  PHQ-9 Score 0 - - - -  Difficult doing work/chores Not difficult at all - - - -    Hypertension:  BP Readings from Last 3 Encounters:  07/01/19 118/78  12/24/18 112/74  06/04/18 132/82    Obesity: Wt Readings from Last 3 Encounters:  07/01/19 (!) 316 lb 12.8 oz (143.7 kg)  12/24/18 (!) 310 lb 11.2 oz (140.9 kg)  06/04/18 297 lb 6.4 oz (134.9 kg)   BMI Readings from Last 3 Encounters:  07/01/19 38.56 kg/m  12/24/18 37.82 kg/m  06/04/18 36.20 kg/m     Lipids:  Lab Results  Component Value Date   CHOL 192 03/01/2018   CHOL 177 07/05/2016   CHOL 190 02/15/2016   Lab Results  Component Value Date   HDL 35 (L) 03/01/2018   HDL 29 (L) 07/05/2016   HDL 31 (L) 02/15/2016   Lab Results  Component Value Date   LDLCALC 121 (H) 03/01/2018   LDLCALC 106 07/05/2016   LDLCALC 121 (H) 02/15/2016   Lab Results  Component  Value Date   TRIG 251 (H) 03/01/2018   TRIG 208 (H) 07/05/2016   TRIG 188 (H) 02/15/2016   Lab Results  Component Value Date   CHOLHDL 5.5 (H) 03/01/2018   CHOLHDL 6.1 (H) 07/05/2016   CHOLHDL 6.1 (H) 02/15/2016   No results found for: LDLDIRECT Glucose:  Glucose  Date Value Ref Range Status  02/15/2016 95 65 - 99 mg/dL Final  01/11/2013 100 (H) 65 - 99 mg/dL Final   Glucose, Bld  Date Value Ref Range Status  12/24/2018 91 65 - 99 mg/dL Final    Comment:    .            Fasting reference interval .   03/01/2018 98 65 - 99 mg/dL Final    Comment:    .            Fasting reference interval .       Office Visit from 07/01/2019 in [facility]  AUDIT-C Score  1      Married STD testing and prevention (HIV/chl/gon/syphilis): declines  Hep C: declines   Skin cancer: discussed Colorectal cancer: mother had it; last colonscopy was 2017- due every 5 years.  Prostate cancer: ordered Lab Results  Component Value Date   PSA 0.7 06/01/2017   PSA 0.7 07/05/2016    Lung cancer:  Low Dose CT Chest recommended if Age 23-80 years, 30 pack-year currently smoking OR have quit w/in 15years. Patient does not qualify.   AAA:  The USPSTF recommends one-time screening with ultrasonography in men ages 19 to 60 years who have ever smoked   Advanced Care Planning: A voluntary discussion about advance care planning including the explanation and discussion of advance directives.  Discussed health care proxy and Living will, and the patient was able to identify a health care proxy as wife, Chris Baldwin.  Patient does not have a living will at present time. If patient does have living will, I have requested they bring this to the clinic to be scanned in to their chart.  Patient Active Problem List   Diagnosis Date Noted  . Insomnia 01/21/2019  . Obesity (BMI 35.0-39.9 without comorbidity) 03/01/2018  . Low HDL (under 40) 03/01/2018  . Fatty liver 03/01/2018  .  GAD (generalized anxiety disorder) 09/11/2017  . Vitamin D deficiency 07/05/2016  . Obstructive sleep apnea 01/18/2016  . Low testosterone 08/25/2015    Past Surgical History:  Procedure Laterality Date  . APPENDECTOMY     in young adult years  . COLONOSCOPY WITH PROPOFOL N/A 04/27/2016   Procedure: COLONOSCOPY WITH PROPOFOL;  Surgeon: Manya Silvas, MD;  Location: [facility] ENDOSCOPY;  Service: Endoscopy;  Laterality: N/A;  . SURGERY SCROTAL / TESTICULAR     young adult years  . TONSILLECTOMY Bilateral    in childhood    Family History  Problem Relation Age of Onset  . Heart disease Mother   . Diabetes Mother   . Cancer Mother        colon  . Arthritis Mother   . Heart disease Father   . Arthritis Father   . Alcohol abuse Maternal Uncle     Social History   Socioeconomic History  . Marital status: Married    Spouse name: angela   . Number of children: 1  . Years of education: Not on file  . Highest education level: Associate degree: occupational, Hotel manager, or vocational program  Occupational History  . Not on file  Social Needs  . Financial resource strain: Not hard at all  . Food insecurity    Worry: Never true    Inability: Never true  . Transportation needs    Medical: No    Non-medical: No  Tobacco Use  . Smoking status: Former Smoker    Years: 1.00    Types: Cigarettes    Quit date: 02/19/2013    Years since quitting: 6.3  . Smokeless tobacco: Never Used  Substance and Sexual Activity  . Alcohol use: Not Currently    Alcohol/week: 0.0 - 1.0 standard drinks    Comment: occasional  . Drug use: Not Currently  . Sexual activity: Yes    Partners: Female  Lifestyle  . Physical activity    Days per week: 7 days    Minutes per session: 150+ min  . Stress: Rather much  Relationships  . Social connections    Talks on phone: More than three times a week    Gets together: More than three times a week    Attends religious service: More than 4 times per  year    Active member of club or organization: Yes    Attends meetings of clubs or organizations: More than 4 times per year    Relationship status: Married  . Intimate partner violence    Fear of current or ex partner: No    Emotionally abused: No    Physically abused: No    Forced sexual activity: No  Other Topics Concern  . Not on file  Social History Narrative  . Not on file     Current Outpatient Medications:  .  cetirizine (ZYRTEC) 10 MG tablet, Take 10 mg daily by mouth., Disp: , Rfl:  .  fluticasone (FLONASE) 50 MCG/ACT nasal spray, Place 2 sprays into both nostrils daily., Disp: 16 g, Rfl: 6 .  mirtazapine (REMERON) 7.5 MG tablet, TAKE 1 TABLET (7.5 MG TOTAL) BY MOUTH AT BEDTIME. ANXIETY AND SLEEP (Patient not taking: Reported on 07/01/2019), Disp: 90 tablet, Rfl: 1  Allergies  Allergen Reactions  . Nexium [Esomeprazole Magnesium] Other (See Comments)    Brand name Brand name Brand name Brand name Brand name     Review of Systems  Constitutional: Negative for chills, fever and malaise/fatigue.  HENT: Negative for congestion, sinus pain and sore throat.   Eyes: Negative for blurred vision.  Respiratory: Negative for cough and shortness of breath.   Cardiovascular: Negative for chest pain, palpitations and leg swelling.  Gastrointestinal: Negative for abdominal pain, constipation, diarrhea and nausea.  Genitourinary: Negative for dysuria.  Musculoskeletal: Negative for falls and joint pain.  Skin: Negative for rash.  Neurological: Negative for dizziness and headaches.  Endo/Heme/Allergies: Negative for polydipsia.  Psychiatric/Behavioral: The patient is not nervous/anxious and does not have insomnia.       Objective  Vitals:   07/01/19 1504  BP: 118/78  Pulse: 64  Resp: 14  Temp: (!) 96.6 F (35.9 C)  Weight: (!) 316 lb 12.8 oz (143.7 kg)  Height: 6\' 4"  (1.93 m)    Body mass index is 38.56 kg/m.  Physical Exam Constitutional: Patient appears  well-developed and well-nourished. No distress.  HENT: Head: Normocephalic and atraumatic. Ears: B TMs ok, no erythema or effusion; Nose: Nose normal. Mouth/Throat: Oropharynx is clear and moist. No oropharyngeal exudate.  Eyes: Conjunctivae and EOM are normal. Pupils are equal, round, and reactive to light. No scleral icterus.  Neck: Normal range of motion. Neck supple. No JVD present. No thyromegaly present.  Cardiovascular: Normal rate, regular rhythm and normal heart sounds.  No murmur heard. No BLE edema. Pulmonary/Chest: Effort normal and breath sounds normal. No respiratory distress. Abdominal: Soft. Bowel sounds are normal, no distension. There is no tenderness. no masses MALE GENITALIA: Normal descended testes bilaterally, no masses palpated, no hernias, no lesions, no discharge RECTAL: Prostate normal size and consistency, no rectal masses or hemorrhoids Musculoskeletal: Normal range of motion, no joint effusions. No gross deformities Neurological: he is alert and oriented to person, place, and time. No cranial nerve deficit. Coordination, balance, strength, speech and gait are normal.  Skin: Skin is warm and dry. No rash noted. No erythema.  Psychiatric: Patient has a normal mood and affect. behavior is normal. Judgment and thought content normal.   No results found for this or any previous visit (from the past 2160 hour(s)).     Fall Risk: Fall Risk  07/01/2019 12/24/2018 06/04/2018 03/01/2018 11/02/2017  Falls in the past year? 0 0 No No No  Number falls in past yr: 0 0 - - -  Injury with Fall? 0  0 - - -      Functional Status Survey: Is the patient deaf or have difficulty hearing?: No Does the patient have difficulty seeing, even when wearing glasses/contacts?: No Does the patient have difficulty concentrating, remembering, or making decisions?: No Does the patient have difficulty walking or climbing stairs?: No Does the patient have difficulty dressing or bathing?:  No Does the patient have difficulty doing errands alone such as visiting a doctor's office or shopping?: No    Assessment & Plan  1. Preventative health care - Lipid Profile - Basic Metabolic Panel (BMET) - 123456  2. Need for Tdap vaccination - Tdap vaccine greater than or equal to 7yo IM  3. Other general symptoms and signs - Basic Metabolic Panel (BMET) - 123456  4. Encounter for hepatitis C screening test for low risk patient - Hepatitis C Antibody  5. Hypogonadism in male Patient request, last testosterone normal  - Testosterone  -Prostate cancer screening and PSA options (with potential risks and benefits of testing vs not testing) were discussed along with recent recs/guidelines. -USPSTF grade A and B recommendations reviewed with patient; age-appropriate recommendations, preventive care, screening tests, etc discussed and encouraged; healthy living encouraged; see AVS for patient education given to patient -Discussed importance of 150 minutes of physical activity weekly, eat two servings of fish weekly, eat one serving of tree nuts ( cashews, pistachios, pecans, almonds.Marland Kitchen) every other day, eat 6 servings of fruit/vegetables daily and drink plenty of water and avoid sweet beverages.

## 2019-07-03 LAB — LIPID PANEL
Cholesterol: 207 mg/dL — ABNORMAL HIGH (ref <200)
HDL: 36 mg/dL — ABNORMAL LOW (ref >=40)
LDL Cholesterol (Calc): 129 mg/dL (calc) — ABNORMAL HIGH
Non-HDL Cholesterol (Calc): 171 mg/dL (calc) — ABNORMAL HIGH (ref <130)
Total CHOL/HDL Ratio: 5.8 (calc) — ABNORMAL HIGH (ref <5.0)
Triglycerides: 274 mg/dL — ABNORMAL HIGH (ref <150)

## 2019-07-03 LAB — BASIC METABOLIC PANEL
BUN: 16 mg/dL (ref 7–25)
CO2: 25 mmol/L (ref 20–32)
Calcium: 9.6 mg/dL (ref 8.6–10.3)
Chloride: 106 mmol/L (ref 98–110)
Creat: 0.92 mg/dL (ref 0.60–1.35)
Glucose, Bld: 97 mg/dL (ref 65–99)
Potassium: 4.1 mmol/L (ref 3.5–5.3)
Sodium: 140 mmol/L (ref 135–146)

## 2019-07-03 LAB — TESTOSTERONE: Testosterone: 284 ng/dL (ref 250–827)

## 2019-07-03 LAB — HEPATITIS C ANTIBODY
Hepatitis C Ab: NONREACTIVE
SIGNAL TO CUT-OFF: 0.03 (ref <1.00)

## 2019-07-03 LAB — VITAMIN B12: Vitamin B-12: 459 pg/mL (ref 200–1100)

## 2019-07-10 ENCOUNTER — Other Ambulatory Visit: Payer: Self-pay

## 2019-07-10 DIAGNOSIS — Z20822 Contact with and (suspected) exposure to covid-19: Secondary | ICD-10-CM

## 2019-07-11 LAB — NOVEL CORONAVIRUS, NAA: SARS-CoV-2, NAA: DETECTED — AB

## 2019-11-21 ENCOUNTER — Other Ambulatory Visit: Payer: Self-pay | Admitting: Student

## 2019-11-21 ENCOUNTER — Other Ambulatory Visit: Payer: Self-pay | Admitting: Hospitalist

## 2019-11-21 DIAGNOSIS — R1013 Epigastric pain: Secondary | ICD-10-CM

## 2019-12-02 ENCOUNTER — Ambulatory Visit
Admission: RE | Admit: 2019-12-02 | Discharge: 2019-12-02 | Disposition: A | Discharge status: Home / Self Care | Payer: PRIVATE HEALTH INSURANCE | Source: Ambulatory Visit | Attending: Student | Admitting: Student

## 2019-12-02 ENCOUNTER — Other Ambulatory Visit: Payer: Self-pay

## 2019-12-02 DIAGNOSIS — R1013 Epigastric pain: Secondary | ICD-10-CM | POA: Insufficient documentation

## 2019-12-02 MED ORDER — TECHNETIUM TC 99M MEBROFENIN IV KIT
5.0000 | PACK | Freq: Once | INTRAVENOUS | Status: AC | PRN
  Administered 2019-12-02: 5.297 via INTRAVENOUS

## 2019-12-02 MED ORDER — TECHNETIUM TC 99M MEDRONATE IV KIT: 20.0000 | PACK | Freq: Once | INTRAVENOUS | Status: DC | PRN

## 2019-12-24 DIAGNOSIS — I700 Atherosclerosis of aorta: Secondary | ICD-10-CM | POA: Insufficient documentation

## 2020-11-24 ENCOUNTER — Other Ambulatory Visit: Payer: Self-pay

## 2020-11-24 ENCOUNTER — Ambulatory Visit: Payer: BC Managed Care – PPO | Admitting: Gastroenterology

## 2020-11-24 ENCOUNTER — Encounter: Payer: Self-pay | Admitting: Gastroenterology

## 2020-11-24 VITALS — BP 142/109 | HR 80 | Temp 98.0°F | Ht 77.0 in | Wt 330.0 lb

## 2020-11-24 DIAGNOSIS — K219 Gastro-esophageal reflux disease without esophagitis: Secondary | ICD-10-CM

## 2020-11-24 DIAGNOSIS — K760 Fatty (change of) liver, not elsewhere classified: Secondary | ICD-10-CM

## 2020-11-24 DIAGNOSIS — Z800 Family history of malignant neoplasm of digestive organs: Secondary | ICD-10-CM

## 2020-11-24 MED ORDER — NA SULFATE-K SULFATE-MG SULF 17.5-3.13-1.6 GM/177ML PO SOLN: ORAL | 0 refills | Status: DC

## 2020-11-25 ENCOUNTER — Other Ambulatory Visit: Payer: Self-pay

## 2020-11-25 DIAGNOSIS — K219 Gastro-esophageal reflux disease without esophagitis: Secondary | ICD-10-CM

## 2020-11-25 DIAGNOSIS — Z800 Family history of malignant neoplasm of digestive organs: Secondary | ICD-10-CM

## 2020-11-25 MED ORDER — PEG 3350-KCL-NA BICARB-NACL 420 G PO SOLR: 4000.0000 mL | Freq: Once | ORAL | 0 refills | Status: AC

## 2020-11-26 NOTE — Progress Notes (Signed)
Chris Baldwin [address]  [place]  [place], [place] [address]  Main: [phone]  Fax: [phone]   Gastroenterology Consultation  Referring Provider:     Ebbie Ridge, MD Primary Care Physician:  Ebbie Ridge, MD Reason for Consultation:   GERD        HPI:     Chris Baldwin is a 45 y.o. y/o male referred for consultation & management  by Dr. Ola Spurr, Youlanda Roys, MD.  Patient reports having severe heartburn symptoms recently and was started on Pepcid.  This has helped.  However, given the severity of his symptoms that concerned him.  He has had EGD and colonoscopy by Dr. Vira Agar before.  Initial one was in 2014 for abdominal pain, heme positive stool and this showed hiatal hernia, gastric erythema and biopsies were done.  3 small polyps were removed.  Pathology showed tubular adenoma the colon polyps.  2017 colonoscopy with 1 small polyp removed which was hyperplastic.  Reports family history of colon cancer in his mother.  No dysphagia, nausea or vomiting or abdominal pain.  No weight loss  Past Medical History:  Diagnosis Date  . Allergy   . GERD (gastroesophageal reflux disease)   . Sleep apnea     Past Surgical History:  Procedure Laterality Date  . APPENDECTOMY     in young adult years  . COLONOSCOPY WITH PROPOFOL N/A 04/27/2016   Procedure: COLONOSCOPY WITH PROPOFOL;  Surgeon: Manya Silvas, MD;  Location: [facility] ENDOSCOPY;  Service: Endoscopy;  Laterality: N/A;  . SURGERY SCROTAL / TESTICULAR     young adult years  . TONSILLECTOMY Bilateral    in childhood    Prior to Admission medications   Medication Sig Start Date End Date Taking? Authorizing Provider  cetirizine (ZYRTEC) 10 MG tablet Take 10 mg daily by mouth.   Yes [provider]  clomiPHENE (CLOMID) 50 MG tablet Take 0.5 mg by mouth daily. 10/18/20  Yes [provider]  famotidine (PEPCID) 40 MG tablet Take 40 mg by mouth 2 (two) times daily. 10/06/20   Yes [provider]  fluticasone (FLONASE) 50 MCG/ACT nasal spray Place 2 sprays into both nostrils daily. 03/01/18  Yes Lada, Satira Anis, MD  mirtazapine (REMERON) 7.5 MG tablet TAKE 1 TABLET (7.5 MG TOTAL) BY MOUTH AT BEDTIME. ANXIETY AND SLEEP 03/15/19  Yes Eappen, Ria Clock, MD  Na Sulfate-K Sulfate-Mg Sulf 17.5-3.13-1.6 GM/177ML SOLN At 5 PM the day before procedure take 1 bottle and 5 hours before procedure take 1 bottle. 11/24/20  Yes Virgel Manifold, MD    Family History  Problem Relation Age of Onset  . Heart disease Mother   . Diabetes Mother   . Cancer Mother        colon  . Arthritis Mother   . Heart disease Father   . Arthritis Father   . Alcohol abuse Maternal Uncle      Social History   Tobacco Use  . Smoking status: Former Smoker    Years: 1.00    Types: Cigarettes    Quit date: 02/19/2013    Years since quitting: 7.7  . Smokeless tobacco: Never Used  Vaping Use  . Vaping Use: Never used  Substance Use Topics  . Alcohol use: Not Currently    Alcohol/week: 0.0 - 1.0 standard drinks    Comment: occasional  . Drug use: Not Currently    Allergies as of 11/24/2020 - Review Complete 11/24/2020  Allergen Reaction Noted  .  Nexium [esomeprazole magnesium] Other (See Comments) 02/15/2016    Review of Systems:    All systems reviewed and negative except where noted in HPI.   Physical Exam:  BP (!) 142/109   Pulse 80   Temp 98 F (36.7 C) (Oral)   Ht 6\' 5"  (1.956 m)   Wt (!) 330 lb (149.7 kg)   BMI 39.13 kg/m  No LMP for male patient. Psych:  Alert and cooperative. Normal mood and affect. General:   Alert,  Well-developed, well-nourished, pleasant and cooperative in NAD Head:  Normocephalic and atraumatic. Eyes:  Sclera clear, no icterus.   Conjunctiva pink. Ears:  Normal auditory acuity. Nose:  No deformity, discharge, or lesions. Mouth:  No deformity or lesions,oropharynx pink & moist. Neck:  Supple; no masses or thyromegaly. Abdomen:  Normal  bowel sounds.  No bruits.  Soft, non-tender and non-distended without masses, hepatosplenomegaly or hernias noted.  No guarding or rebound tenderness.    Msk:  Symmetrical without gross deformities. Good, equal movement & strength bilaterally. Pulses:  Normal pulses noted. Extremities:  No clubbing or edema.  No cyanosis. Neurologic:  Alert and oriented x3;  grossly normal neurologically. Skin:  Intact without significant lesions or rashes. No jaundice. Lymph Nodes:  No significant cervical adenopathy. Psych:  Alert and cooperative. Normal mood and affect.   Labs: CBC    Component Value Date/Time   WBC 6.5 12/24/2018 0937   RBC 4.91 12/24/2018 0937   HGB 15.0 12/24/2018 0937   HGB 14.8 08/27/2015 0822   HCT 43.7 12/24/2018 0937   HCT 43.6 08/27/2015 0822   PLT 214 12/24/2018 0937   PLT 195 08/27/2015 0822   MCV 89.0 12/24/2018 0937   MCV 89 08/27/2015 0822   MCV 89 01/11/2013 0859   MCH 30.5 12/24/2018 0937   MCHC 34.3 12/24/2018 0937   RDW 12.8 12/24/2018 0937   RDW 13.7 08/27/2015 0822   RDW 13.1 01/11/2013 0859   LYMPHSABS 2,366 12/24/2018 0937   LYMPHSABS 2.4 08/27/2015 0822   MONOABS 396 06/01/2017 1533   EOSABS 247 12/24/2018 0937   EOSABS 0.2 08/27/2015 0822   BASOSABS 52 12/24/2018 0937   BASOSABS 0.0 08/27/2015 0822   CMP     Component Value Date/Time   NA 140 07/02/2019 0813   NA 140 02/15/2016 1028   NA 141 01/11/2013 0859   K 4.1 07/02/2019 0813   K 4.0 01/11/2013 0859   CL 106 07/02/2019 0813   CL 111 (H) 01/11/2013 0859   CO2 25 07/02/2019 0813   CO2 24 01/11/2013 0859   GLUCOSE 97 07/02/2019 0813   GLUCOSE 100 (H) 01/11/2013 0859   BUN 16 07/02/2019 0813   BUN 13 02/15/2016 1028   BUN 10 01/11/2013 0859   CREATININE 0.92 07/02/2019 0813   CALCIUM 9.6 07/02/2019 0813   CALCIUM 8.4 (L) 01/11/2013 0859   PROT 7.4 12/24/2018 0937   PROT 7.0 02/15/2016 1028   PROT 7.1 01/11/2013 0859   ALBUMIN 4.6 02/15/2016 1028   ALBUMIN 3.8 01/11/2013 0859    AST 19 12/24/2018 0937   AST 58 (H) 01/11/2013 0859   ALT 31 12/24/2018 0937   ALT 91 (H) 01/11/2013 0859   ALKPHOS 49 02/15/2016 1028   ALKPHOS 64 01/11/2013 0859   BILITOT 0.4 12/24/2018 0937   BILITOT 0.3 02/15/2016 1028   BILITOT 0.3 01/11/2013 0859   GFRNONAA 91 12/24/2018 0937   GFRAA 106 12/24/2018 0937    Imaging Studies: No results found.  Assessment and  Plan:   Chris Baldwin is a 45 y.o. y/o male has been referred for GERD  Due to severe symptoms with some improvement in Pepcid, patient interested in endoscopic evaluation at this time  Alternative options of conservative management were discussed in detail, including but not limited to medication management, foregoing endoscopic procedures at this time and others.    Also due for repeat colonoscopy due to family history of colon cancer  I have discussed alternative options, risks & benefits,  which include, but are not limited to, bleeding, infection, perforation,respiratory complication & drug reaction.  The patient agrees with this plan & written consent will be obtained.    Has previous history of fatty liver and elevated liver enzymes due to this  Finding of fatty liver on imaging discussed with patient Diet, weight loss, and exercise encouraged along with avoiding hepatotoxic drugs including alcohol Risk of progression to cirrhosis if above measures are not instituted were discussed as well, and patient verbalized understanding  Will need to obtain previous work-up for fatty liver and order any further work-up needed on future appointments.  I see hepatitis a and B vaccines given in 2016 and 2017    Dr Chris Baldwin  Speech recognition software was used to dictate the above note.

## 2020-12-23 ENCOUNTER — Other Ambulatory Visit
Admission: RE | Admit: 2020-12-23 | Discharge: 2020-12-23 | Disposition: A | Discharge status: Home / Self Care | Payer: BC Managed Care – PPO | Source: Ambulatory Visit | Attending: Gastroenterology | Admitting: Gastroenterology

## 2020-12-23 ENCOUNTER — Other Ambulatory Visit: Payer: Self-pay

## 2020-12-23 DIAGNOSIS — Z800 Family history of malignant neoplasm of digestive organs: Secondary | ICD-10-CM | POA: Diagnosis not present

## 2020-12-23 DIAGNOSIS — Z8261 Family history of arthritis: Secondary | ICD-10-CM | POA: Diagnosis not present

## 2020-12-23 DIAGNOSIS — Z20822 Contact with and (suspected) exposure to covid-19: Secondary | ICD-10-CM | POA: Diagnosis not present

## 2020-12-23 DIAGNOSIS — Z8249 Family history of ischemic heart disease and other diseases of the circulatory system: Secondary | ICD-10-CM | POA: Diagnosis not present

## 2020-12-23 DIAGNOSIS — R109 Unspecified abdominal pain: Secondary | ICD-10-CM | POA: Diagnosis present

## 2020-12-23 DIAGNOSIS — Z79899 Other long term (current) drug therapy: Secondary | ICD-10-CM | POA: Diagnosis not present

## 2020-12-23 DIAGNOSIS — Z87891 Personal history of nicotine dependence: Secondary | ICD-10-CM | POA: Diagnosis not present

## 2020-12-23 DIAGNOSIS — Z833 Family history of diabetes mellitus: Secondary | ICD-10-CM | POA: Diagnosis not present

## 2020-12-23 DIAGNOSIS — Q438 Other specified congenital malformations of intestine: Secondary | ICD-10-CM | POA: Diagnosis not present

## 2020-12-23 DIAGNOSIS — K635 Polyp of colon: Secondary | ICD-10-CM | POA: Diagnosis not present

## 2020-12-23 DIAGNOSIS — D125 Benign neoplasm of sigmoid colon: Secondary | ICD-10-CM | POA: Diagnosis not present

## 2020-12-23 DIAGNOSIS — K2100 Gastro-esophageal reflux disease with esophagitis, without bleeding: Secondary | ICD-10-CM | POA: Diagnosis not present

## 2020-12-23 DIAGNOSIS — Z01812 Encounter for preprocedural laboratory examination: Secondary | ICD-10-CM | POA: Insufficient documentation

## 2020-12-23 DIAGNOSIS — Z888 Allergy status to other drugs, medicaments and biological substances status: Secondary | ICD-10-CM | POA: Diagnosis not present

## 2020-12-23 DIAGNOSIS — Z1211 Encounter for screening for malignant neoplasm of colon: Secondary | ICD-10-CM | POA: Diagnosis present

## 2020-12-23 DIAGNOSIS — K3189 Other diseases of stomach and duodenum: Secondary | ICD-10-CM | POA: Diagnosis not present

## 2020-12-23 DIAGNOSIS — R12 Heartburn: Secondary | ICD-10-CM | POA: Diagnosis present

## 2020-12-23 DIAGNOSIS — Z811 Family history of alcohol abuse and dependence: Secondary | ICD-10-CM | POA: Diagnosis not present

## 2020-12-23 LAB — SARS CORONAVIRUS 2 (TAT 6-24 HRS): SARS Coronavirus 2: NEGATIVE

## 2020-12-25 ENCOUNTER — Encounter
Admission: RE | Disposition: A | Discharge status: Home / Self Care | Payer: Self-pay | Source: Home / Self Care | Attending: Gastroenterology

## 2020-12-25 ENCOUNTER — Ambulatory Visit: Payer: BC Managed Care – PPO | Admitting: Anesthesiology

## 2020-12-25 ENCOUNTER — Ambulatory Visit
Admission: RE | Admit: 2020-12-25 | Discharge: 2020-12-25 | Disposition: A | Discharge status: Home / Self Care | Payer: BC Managed Care – PPO | Attending: Gastroenterology | Admitting: Gastroenterology

## 2020-12-25 DIAGNOSIS — Z1211 Encounter for screening for malignant neoplasm of colon: Secondary | ICD-10-CM | POA: Diagnosis not present

## 2020-12-25 DIAGNOSIS — K219 Gastro-esophageal reflux disease without esophagitis: Secondary | ICD-10-CM

## 2020-12-25 DIAGNOSIS — Z8249 Family history of ischemic heart disease and other diseases of the circulatory system: Secondary | ICD-10-CM | POA: Insufficient documentation

## 2020-12-25 DIAGNOSIS — K253 Acute gastric ulcer without hemorrhage or perforation: Secondary | ICD-10-CM

## 2020-12-25 DIAGNOSIS — Z87891 Personal history of nicotine dependence: Secondary | ICD-10-CM | POA: Insufficient documentation

## 2020-12-25 DIAGNOSIS — Z800 Family history of malignant neoplasm of digestive organs: Secondary | ICD-10-CM

## 2020-12-25 DIAGNOSIS — K635 Polyp of colon: Secondary | ICD-10-CM

## 2020-12-25 DIAGNOSIS — K3189 Other diseases of stomach and duodenum: Secondary | ICD-10-CM | POA: Insufficient documentation

## 2020-12-25 DIAGNOSIS — Z811 Family history of alcohol abuse and dependence: Secondary | ICD-10-CM | POA: Insufficient documentation

## 2020-12-25 DIAGNOSIS — Z888 Allergy status to other drugs, medicaments and biological substances status: Secondary | ICD-10-CM | POA: Insufficient documentation

## 2020-12-25 DIAGNOSIS — Z833 Family history of diabetes mellitus: Secondary | ICD-10-CM | POA: Insufficient documentation

## 2020-12-25 DIAGNOSIS — Z20822 Contact with and (suspected) exposure to covid-19: Secondary | ICD-10-CM | POA: Insufficient documentation

## 2020-12-25 DIAGNOSIS — Z79899 Other long term (current) drug therapy: Secondary | ICD-10-CM | POA: Insufficient documentation

## 2020-12-25 DIAGNOSIS — D125 Benign neoplasm of sigmoid colon: Secondary | ICD-10-CM | POA: Insufficient documentation

## 2020-12-25 DIAGNOSIS — R12 Heartburn: Secondary | ICD-10-CM | POA: Insufficient documentation

## 2020-12-25 DIAGNOSIS — Z8261 Family history of arthritis: Secondary | ICD-10-CM | POA: Insufficient documentation

## 2020-12-25 DIAGNOSIS — R109 Unspecified abdominal pain: Secondary | ICD-10-CM | POA: Insufficient documentation

## 2020-12-25 DIAGNOSIS — K2100 Gastro-esophageal reflux disease with esophagitis, without bleeding: Secondary | ICD-10-CM | POA: Insufficient documentation

## 2020-12-25 DIAGNOSIS — Q438 Other specified congenital malformations of intestine: Secondary | ICD-10-CM | POA: Insufficient documentation

## 2020-12-25 HISTORY — PX: ESOPHAGOGASTRODUODENOSCOPY (EGD) WITH PROPOFOL: SHX5813

## 2020-12-25 HISTORY — PX: COLONOSCOPY WITH PROPOFOL: SHX5780

## 2020-12-25 SURGERY — ESOPHAGOGASTRODUODENOSCOPY (EGD) WITH PROPOFOL
Anesthesia: General

## 2020-12-25 MED ORDER — FENTANYL CITRATE (PF) 100 MCG/2ML IJ SOLN
INTRAMUSCULAR | Status: AC
  Filled 2020-12-25: qty 2

## 2020-12-25 MED ORDER — PROPOFOL 500 MG/50ML IV EMUL
INTRAVENOUS | Status: AC
  Filled 2020-12-25: qty 50

## 2020-12-25 MED ORDER — PROPOFOL 10 MG/ML IV BOLUS
INTRAVENOUS | Status: DC | PRN
  Administered 2020-12-25: 100 mg via INTRAVENOUS

## 2020-12-25 MED ORDER — FENTANYL CITRATE (PF) 100 MCG/2ML IJ SOLN
INTRAMUSCULAR | Status: DC | PRN
  Administered 2020-12-25: 25 ug via INTRAVENOUS
  Administered 2020-12-25: 50 ug via INTRAVENOUS

## 2020-12-25 MED ORDER — SODIUM CHLORIDE 0.9 % IV SOLN
INTRAVENOUS | Status: DC
  Administered 2020-12-25: 20 mL/h via INTRAVENOUS

## 2020-12-25 MED ORDER — MIDAZOLAM HCL 2 MG/2ML IJ SOLN
INTRAMUSCULAR | Status: AC
  Filled 2020-12-25: qty 2

## 2020-12-25 MED ORDER — MIDAZOLAM HCL 2 MG/2ML IJ SOLN
INTRAMUSCULAR | Status: DC | PRN
  Administered 2020-12-25 (×2): 1 mg via INTRAVENOUS

## 2020-12-25 MED ORDER — PROPOFOL 500 MG/50ML IV EMUL
INTRAVENOUS | Status: DC | PRN
  Administered 2020-12-25: 130 ug/kg/min via INTRAVENOUS

## 2020-12-25 NOTE — Transfer of Care (Signed)
Immediate Anesthesia Transfer of Care Note  Patient: Chris Baldwin  Procedure(s) Performed: ESOPHAGOGASTRODUODENOSCOPY (EGD) WITH PROPOFOL (N/A ) COLONOSCOPY WITH PROPOFOL (N/A )  Patient Location: Endoscopy Unit  Anesthesia Type:General  Level of Consciousness: drowsy and patient cooperative  Airway & Oxygen Therapy: Patient Spontanous Breathing  Post-op Assessment: Report given to RN and Post -op Vital signs reviewed and stable  Post vital signs: Reviewed and stable  Last Vitals:  Vitals Value Taken Time  BP 108/75 12/25/20 1019  Temp    Pulse 83 12/25/20 1019  Resp 20 12/25/20 1019  SpO2 98 % 12/25/20 1019    Last Pain:  Vitals:   12/25/20 0829  TempSrc: Temporal         Complications: No complications documented.

## 2020-12-25 NOTE — Anesthesia Preprocedure Evaluation (Signed)
Anesthesia Evaluation  Patient identified by MRN, date of birth, ID band Patient awake    Reviewed: Allergy & Precautions, H&P , NPO status , Patient's Chart, lab work & pertinent test results, reviewed documented beta blocker date and time   Airway Mallampati: II   Neck ROM: full    Dental  (+) Teeth Intact   Pulmonary sleep apnea , former smoker,    Pulmonary exam normal        Cardiovascular Exercise Tolerance: Poor negative cardio ROS Normal cardiovascular exam Rhythm:regular Rate:Normal     Neuro/Psych PSYCHIATRIC DISORDERS Anxiety negative neurological ROS     GI/Hepatic Neg liver ROS, GERD  Medicated,  Endo/Other  Morbid obesity  Renal/GU negative Renal ROS  negative genitourinary   Musculoskeletal   Abdominal   Peds  Hematology negative hematology ROS (+)   Anesthesia Other Findings Past Medical History: No date: Allergy No date: GERD (gastroesophageal reflux disease) No date: Sleep apnea Past Surgical History: No date: APPENDECTOMY     Comment:  in young adult years 04/27/2016: COLONOSCOPY WITH PROPOFOL; N/A     Comment:  Procedure: COLONOSCOPY WITH PROPOFOL;  Surgeon: Manya Silvas, MD;  Location: [facility];  Service:               Endoscopy;  Laterality: N/A; No date: SURGERY SCROTAL / TESTICULAR     Comment:  young adult years No date: TONSILLECTOMY; Bilateral     Comment:  in childhood BMI    Body Mass Index: 38.34 kg/m     Reproductive/Obstetrics negative OB ROS                             Anesthesia Physical Anesthesia Plan  ASA: III  Anesthesia Plan: General   Post-op Pain Management:    Induction:   PONV Risk Score and Plan:   Airway Management Planned:   Additional Equipment:   Intra-op Plan:   Post-operative Plan:   Informed Consent: I have reviewed the patients History and Physical, chart, labs and discussed the  procedure including the risks, benefits and alternatives for the proposed anesthesia with the patient or authorized representative who has indicated his/her understanding and acceptance.     Dental Advisory Given  Plan Discussed with: CRNA  Anesthesia Plan Comments:         Anesthesia Quick Evaluation

## 2020-12-25 NOTE — Op Note (Signed)
[facility] Patient Name: Chris Baldwin Procedure Date: 12/25/2020 8:52 AM MRN: [id number] Account #: [id number] Date of Birth: 1976/06/02 Admit Type: Outpatient Age: 45 Room: [facility] ENDO ROOM 2 Gender: Male Note Status: Finalized Procedure:             Colonoscopy Indications:           Screening for colorectal malignant neoplasm Providers:             Ruben Mahler B. Bonna Gains MD, MD Medicines:             Monitored Anesthesia Care Complications:         No immediate complications. Procedure:             Pre-Anesthesia Assessment:                        - ASA Grade Assessment: II - A patient with mild                         systemic disease.                        - Prior to the procedure, a History and Physical was                         performed, and patient medications, allergies and                         sensitivities were reviewed. The patient's tolerance                         of previous anesthesia was reviewed.                        - The risks and benefits of the procedure and the                         sedation options and risks were discussed with the                         patient. All questions were answered and informed                         consent was obtained.                        - Patient identification and proposed procedure were                         verified prior to the procedure by the physician, the                         nurse, the anesthesiologist, the anesthetist and the                         technician. The procedure was verified in the                         procedure room.  After obtaining informed consent, the colonoscope was                         passed under direct vision. Throughout the procedure,                         the patient's blood pressure, pulse, and oxygen                         saturations were monitored continuously. The                         Colonoscope was  introduced through the anus with the                         intention of advancing to the cecum. The scope was                         advanced to the ascending colon before the procedure                         was aborted. Medications were given. The colonoscopy                         was performed with ease. The patient tolerated the                         procedure well. The quality of the bowel preparation                         was good. Findings:      The perianal and digital rectal examinations were normal.      Two sessile polyps were found in the descending colon and transverse       colon. The polyps were 3 to 4 mm in size. These polyps were removed with       a cold biopsy forceps. Resection and retrieval were complete.      A 5 mm polyp was found in the sigmoid colon. The polyp was sessile. The       polyp was removed with a cold snare. Resection and retrieval were       complete.      The colon (entire examined portion) was significantly tortuous.       Advancing the scope required changing the patient to a supine position,       changing the patient to a prone position, using manual pressure,       withdrawing and reinserting the scope, straightening and shortening the       scope to obtain bowel loop reduction and applying abdominal pressure.      The exam was otherwise without abnormality.      The retroflexed view of the distal rectum and anal verge was normal and       showed no anal or rectal abnormalities. Impression:            - Two 3 to 4 mm polyps in the descending colon and in                         the transverse colon, removed with a  cold biopsy                         forceps. Resected and retrieved.                        - One 5 mm polyp in the sigmoid colon, removed with a                         cold snare. Resected and retrieved.                        - Tortuous colon.                        - The examination was otherwise normal.                         - The distal rectum and anal verge are normal on                         retroflexion view. Recommendation:        - Perform a virtual colonoscopy at appointment to be                         scheduled.                        - Discharge patient to home (with escort).                        - Advance diet as tolerated.                        - Continue present medications.                        - Await pathology results.                        - Repeat colonoscopy in 5 years (consider abdominal                         binder).                        - The findings and recommendations were discussed with                         the patient.                        - The findings and recommendations were discussed with                         the patient's family.                        - Return to primary care physician as previously                         scheduled. Procedure Code(s):     --- Professional ---  06269, 52, Colonoscopy, flexible; with removal of                         tumor(s), polyp(s), or other lesion(s) by snare                         technique                        45380, 59,52, Colonoscopy, flexible; with biopsy,                         single or multiple Diagnosis Code(s):     --- Professional ---                        K63.5, Polyp of colon                        Z12.11, Encounter for screening for malignant neoplasm                         of colon CPT copyright 2019 American Medical Association. All rights reserved. The codes documented in this report are preliminary and upon coder review may  be revised to meet current compliance requirements.  Vonda Antigua, MD Margretta Sidle B. Bonna Gains MD, MD 12/25/2020 10:25:27 AM This report has been signed electronically. Number of Addenda: 0 Note Initiated On: 12/25/2020 8:52 AM Total Procedure Duration: 0 hours 48 minutes 53 seconds  Estimated Blood Loss:  Estimated blood loss: none.       [facility]

## 2020-12-25 NOTE — Op Note (Signed)
[facility] Patient Name: Chris Baldwin Procedure Date: 12/25/2020 8:53 AM MRN: [id number] Account #: [id number] Date of Birth: 1976/08/27 Admit Type: Outpatient Age: 45 Room: [facility] ENDO ROOM 2 Gender: Male Note Status: Finalized Procedure:             Upper GI endoscopy Indications:           Abdominal pain, Heartburn Providers:             Lashawndra Lampkins B. Bonna Gains MD, MD Medicines:             Monitored Anesthesia Care Complications:         No immediate complications. Procedure:             Pre-Anesthesia Assessment:                        - The risks and benefits of the procedure and the                         sedation options and risks were discussed with the                         patient. All questions were answered and informed                         consent was obtained.                        - Patient identification and proposed procedure were                         verified prior to the procedure.                        - ASA Grade Assessment: II - A patient with mild                         systemic disease.                        After obtaining informed consent, the endoscope was                         passed under direct vision. Throughout the procedure,                         the patient's blood pressure, pulse, and oxygen                         saturations were monitored continuously. The Endoscope                         was introduced through the mouth, and advanced to the                         second part of duodenum. The upper GI endoscopy was                         accomplished with ease. The patient tolerated the  procedure well. Findings:      A single 6 mm mucosal nodule with a localized distribution was found at       the gastroesophageal junction. Biopsies were taken with a cold forceps       for histology.      The exam of the esophagus was otherwise normal.      A single 3 mm erosion  with no bleeding and no stigmata of recent       bleeding was found in the gastric antrum. Biopsies were obtained in the       gastric body, at the incisura and in the gastric antrum with cold       forceps for Helicobacter pylori testing.      The exam of the stomach was otherwise normal.      Patchy mildly erythematous mucosa without active bleeding and with no       stigmata of bleeding was found in the duodenal bulb and in the second       portion of the duodenum. Biopsies were taken with a cold forceps for       histology.      The exam of the duodenum was otherwise normal. Impression:            - Mucosal nodule found in the esophagus. Biopsied.                        - Erosive gastropathy with no bleeding and no stigmata                         of recent bleeding.                        - Erythematous duodenopathy. Biopsied.                        - Biopsies were obtained in the gastric body, at the                         incisura and in the gastric antrum. Recommendation:        - Await pathology results.                        - Discharge patient to home.                        - Continue present medications.                        - Resume previous diet.                        - The findings and recommendations were discussed with                         the patient.                        - The findings and recommendations were discussed with                         the patient's family.                        -  Return to primary care physician as previously                         scheduled. Procedure Code(s):     --- Professional ---                        [phone], Esophagogastroduodenoscopy, flexible,                         transoral; with biopsy, single or multiple Diagnosis Code(s):     --- Professional ---                        K22.8, Other specified diseases of esophagus                        K31.89, Other diseases of stomach and duodenum                         R10.9, Unspecified abdominal pain                        R12, Heartburn CPT copyright 2019 American Medical Association. All rights reserved. The codes documented in this report are preliminary and upon coder review may  be revised to meet current compliance requirements.  Vonda Antigua, MD Margretta Sidle B. Bonna Gains MD, MD 12/25/2020 9:25:40 AM This report has been signed electronically. Number of Addenda: 0 Note Initiated On: 12/25/2020 8:53 AM Estimated Blood Loss:  Estimated blood loss: none.      [facility]

## 2020-12-25 NOTE — H&P (Signed)
Vonda Antigua, MD [address] [place], [place], [place], [address] [address], [place], [place], [place], [address] Phone: [phone]  Fax: [phone]  Primary Care Physician:  Ebbie Ridge, MD   Pre-Procedure History & Physical: HPI:  Chris Baldwin is a 45 y.o. male is here for a colonoscopy and EGD.   Past Medical History:  Diagnosis Date  . Allergy   . GERD (gastroesophageal reflux disease)   . Sleep apnea     Past Surgical History:  Procedure Laterality Date  . APPENDECTOMY     in young adult years  . COLONOSCOPY WITH PROPOFOL N/A 04/27/2016   Procedure: COLONOSCOPY WITH PROPOFOL;  Surgeon: Manya Silvas, MD;  Location: [facility] ENDOSCOPY;  Service: Endoscopy;  Laterality: N/A;  . SURGERY SCROTAL / TESTICULAR     young adult years  . TONSILLECTOMY Bilateral    in childhood    Prior to Admission medications   Medication Sig Start Date End Date Taking? Authorizing Provider  cetirizine (ZYRTEC) 10 MG tablet Take 10 mg daily by mouth.   Yes [provider]  clomiPHENE (CLOMID) 50 MG tablet Take 0.5 mg by mouth daily. 10/18/20  Yes [provider]  famotidine (PEPCID) 40 MG tablet Take 40 mg by mouth 2 (two) times daily. 10/06/20  Yes [provider]  fluticasone (FLONASE) 50 MCG/ACT nasal spray Place 2 sprays into both nostrils daily. 03/01/18  Yes Lada, Satira Anis, MD  mirtazapine (REMERON) 7.5 MG tablet TAKE 1 TABLET (7.5 MG TOTAL) BY MOUTH AT BEDTIME. ANXIETY AND SLEEP 03/15/19  Yes Eappen, Ria Clock, MD  Na Sulfate-K Sulfate-Mg Sulf 17.5-3.13-1.6 GM/177ML SOLN At 5 PM the day before procedure take 1 bottle and 5 hours before procedure take 1 bottle. 11/24/20  Yes Virgel Manifold, MD    Allergies as of 11/25/2020 - Review Complete 11/24/2020  Allergen Reaction Noted  . Nexium [esomeprazole magnesium] Other (See Comments) 02/15/2016    Family History  Problem Relation Age of Onset  . Heart disease Mother   .  Diabetes Mother   . Cancer Mother        colon  . Arthritis Mother   . Heart disease Father   . Arthritis Father   . Alcohol abuse Maternal Uncle     Social History   Socioeconomic History  . Marital status: Married    Spouse name: angela   . Number of children: 1  . Years of education: Not on file  . Highest education level: Associate degree: occupational, Hotel manager, or vocational program  Occupational History  . Not on file  Tobacco Use  . Smoking status: Former Smoker    Years: 1.00    Types: Cigarettes    Quit date: 02/19/2013    Years since quitting: 7.8  . Smokeless tobacco: Never Used  Vaping Use  . Vaping Use: Never used  Substance and Sexual Activity  . Alcohol use: Not Currently    Alcohol/week: 0.0 - 1.0 standard drinks    Comment: occasional  . Drug use: Not Currently  . Sexual activity: Yes    Partners: Female  Other Topics Concern  . Not on file  Social History Narrative  . Not on file   Social Determinants of Health   Financial Resource Strain: Not on file  Food Insecurity: Not on file  Transportation Needs: Not on file  Physical Activity: Not on file  Stress: Not on file  Social Connections: Not on file  Intimate Partner Violence: Not on  file    Review of Systems: See HPI, otherwise negative ROS  Physical Exam: BP (!) 143/92   Pulse 84   Temp (!) 97.2 F (36.2 C) (Temporal)   Resp 20   Ht 6\' 4"  (1.93 m)   Wt (!) 142.9 kg   SpO2 97%   BMI 38.34 kg/m  General:   Alert,  pleasant and cooperative in NAD Head:  Normocephalic and atraumatic. Neck:  Supple; no masses or thyromegaly. Lungs:  Clear throughout to auscultation, normal respiratory effort.    Heart:  +S1, +S2, Regular rate and rhythm, No edema. Abdomen:  Soft, nontender and nondistended. Normal bowel sounds, without guarding, and without rebound.   Neurologic:  Alert and  oriented x4;  grossly normal neurologically.  Impression/Plan: Chris Baldwin is here for a  colonoscopy to be performed for family history of colon cancer and EGD for Acid Reflux.  Risks, benefits, limitations, and alternatives regarding the procedures have been reviewed with the patient.  Questions have been answered.  All parties agreeable.   Virgel Manifold, MD  12/25/2020, 8:57 AM

## 2020-12-27 NOTE — Anesthesia Postprocedure Evaluation (Signed)
Anesthesia Post Note  Patient: Chris Baldwin  Procedure(s) Performed: ESOPHAGOGASTRODUODENOSCOPY (EGD) WITH PROPOFOL (N/A ) COLONOSCOPY WITH PROPOFOL (N/A )  Patient location during evaluation: PACU Anesthesia Type: General Level of consciousness: awake and alert Pain management: pain level controlled Vital Signs Assessment: post-procedure vital signs reviewed and stable Respiratory status: spontaneous breathing, nonlabored ventilation, respiratory function stable and patient connected to nasal cannula oxygen Cardiovascular status: blood pressure returned to baseline and stable Postop Assessment: no apparent nausea or vomiting Anesthetic complications: no   No complications documented.   Last Vitals:  Vitals:   12/25/20 1039 12/25/20 1049  BP: 115/90 115/79  Pulse: 80 80  Resp: 18 16  Temp:    SpO2: 99% 100%    Last Pain:  Vitals:   12/26/20 1311  TempSrc:   PainSc: [name]

## 2020-12-28 ENCOUNTER — Telehealth: Payer: Self-pay | Admitting: Gastroenterology

## 2020-12-28 ENCOUNTER — Encounter: Payer: Self-pay | Admitting: Gastroenterology

## 2020-12-28 LAB — SURGICAL PATHOLOGY

## 2020-12-28 NOTE — Telephone Encounter (Signed)
Patient had procedure on 12/25/2020. Patient states when he is having a bowel movement he is having groin area pulling. No blood in stools. Has had several bowel movements since procedure. Had two Saturday, one Sunday, and one this morning. He states this weekend had a lot of RLQ pain and was even sore to touch. He states that has improved some but still has some pain.

## 2020-12-28 NOTE — Telephone Encounter (Signed)
Patient had Colonoscopy/Endoscopy on 12/25/20.  Patient reports having LRQ pain, a "buldging & pulling feeling" when he goes to the bathroom and stomach cramps after eating his meal last night.  Please call to advise

## 2020-12-29 NOTE — Telephone Encounter (Signed)
Patient calling again to ask for call back, having some symptoms from hi procedure

## 2020-12-30 ENCOUNTER — Ambulatory Visit: Payer: BC Managed Care – PPO | Admitting: Gastroenterology

## 2020-12-30 ENCOUNTER — Ambulatory Visit
Admission: RE | Admit: 2020-12-30 | Discharge: 2020-12-30 | Disposition: A | Discharge status: Home / Self Care | Payer: BC Managed Care – PPO | Source: Ambulatory Visit | Attending: Gastroenterology | Admitting: Gastroenterology

## 2020-12-30 ENCOUNTER — Telehealth: Payer: Self-pay | Admitting: Gastroenterology

## 2020-12-30 ENCOUNTER — Other Ambulatory Visit: Payer: Self-pay

## 2020-12-30 ENCOUNTER — Encounter: Payer: Self-pay | Admitting: Gastroenterology

## 2020-12-30 VITALS — BP 120/78 | HR 71 | Temp 98.7°F | Ht 77.0 in | Wt 329.0 lb

## 2020-12-30 DIAGNOSIS — R1031 Right lower quadrant pain: Secondary | ICD-10-CM

## 2020-12-30 NOTE — Progress Notes (Signed)
Vonda Antigua, MD [address]  [place]  [place], [place] [address]  Main: [phone]  Fax: [phone]   Primary Care Physician: Ebbie Ridge, MD   Chief complaint: abdominal pain  HPI: Chris Baldwin is a 45 y.o. male who underwent EGD and colonoscopy on December 25, 2020 and reports feeling a pulling or popping sensation in his right lower quadrant intermittently since then.  He did say he had pain in that area before but this has subsided and he just feels a pulling sensation in that area intermittently and therefore he is here for further evaluation.  No nausea or vomiting.  No fever or chills.  Has not had a bowel movement in 2 days.  No blood in stool.  Colonoscopy was incomplete as cecum could not be intubated due to tortuous colon despite position changes and abdominal pressure.  Current Outpatient Medications  Medication Sig Dispense Refill  . cetirizine (ZYRTEC) 10 MG tablet Take 10 mg daily by mouth.    . clomiPHENE (CLOMID) 50 MG tablet Take 0.5 mg by mouth daily.    . famotidine (PEPCID) 40 MG tablet Take 40 mg by mouth 2 (two) times daily.    . fluticasone (FLONASE) 50 MCG/ACT nasal spray Place 2 sprays into both nostrils daily. 16 g 6  . mirtazapine (REMERON) 7.5 MG tablet TAKE 1 TABLET (7.5 MG TOTAL) BY MOUTH AT BEDTIME. ANXIETY AND SLEEP 90 tablet 1  . Na Sulfate-K Sulfate-Mg Sulf 17.5-3.13-1.6 GM/177ML SOLN At 5 PM the day before procedure take 1 bottle and 5 hours before procedure take 1 bottle. 354 mL 0   No current facility-administered medications for this visit.    Allergies as of 12/30/2020 - Review Complete 12/30/2020  Allergen Reaction Noted  . Nexium [esomeprazole magnesium] Other (See Comments) 02/15/2016    ROS:  General: Negative for anorexia, weight loss, fever, chills, fatigue, weakness. ENT: Negative for hoarseness, difficulty swallowing , nasal congestion. CV: Negative for chest pain, angina, palpitations, dyspnea  on exertion, peripheral edema.  Respiratory: Negative for dyspnea at rest, dyspnea on exertion, cough, sputum, wheezing.  GI: See history of present illness. GU:  Negative for dysuria, hematuria, urinary incontinence, urinary frequency, nocturnal urination.  Endo: Negative for unusual weight change.    Physical Examination:   BP 120/78   Pulse 71   Temp 98.7 F (37.1 C) (Oral)   Ht 6\' 5"  (1.956 m)   Wt (!) 329 lb (149.2 kg)   BMI 39.01 kg/m   General: Well-nourished, well-developed in no acute distress.  Eyes: No icterus. Conjunctivae pink. Mouth: Oropharyngeal mucosa moist and pink , no lesions erythema or exudate. Neck: Supple, Trachea midline Abdomen: Bowel sounds are normal, nontender, nondistended, no hepatosplenomegaly or masses, no abdominal bruits or hernia , no rebound or guarding.  No inguinal hernias present Extremities: No lower extremity edema. No clubbing or deformities. Neuro: Alert and oriented x 3.  Grossly intact. Skin: Warm and dry, no jaundice.   Psych: Alert and cooperative, normal mood and affect.   Labs: CMP     Component Value Date/Time   NA 140 07/02/2019 0813   NA 140 02/15/2016 1028   NA 141 01/11/2013 0859   K 4.1 07/02/2019 0813   K 4.0 01/11/2013 0859   CL 106 07/02/2019 0813   CL 111 (H) 01/11/2013 0859   CO2 25 07/02/2019 0813   CO2 24 01/11/2013 0859   GLUCOSE 97 07/02/2019 0813   GLUCOSE 100 (H) 01/11/2013 7035  BUN 16 07/02/2019 0813   BUN 13 02/15/2016 1028   BUN 10 01/11/2013 0859   CREATININE 0.92 07/02/2019 0813   CALCIUM 9.6 07/02/2019 0813   CALCIUM 8.4 (L) 01/11/2013 0859   PROT 7.4 12/24/2018 0937   PROT 7.0 02/15/2016 1028   PROT 7.1 01/11/2013 0859   ALBUMIN 4.6 02/15/2016 1028   ALBUMIN 3.8 01/11/2013 0859   AST 19 12/24/2018 0937   AST 58 (H) 01/11/2013 0859   ALT 31 12/24/2018 0937   ALT 91 (H) 01/11/2013 0859   ALKPHOS 49 02/15/2016 1028   ALKPHOS 64 01/11/2013 0859   BILITOT 0.4 12/24/2018 0937   BILITOT  0.3 02/15/2016 1028   BILITOT 0.3 01/11/2013 0859   GFRNONAA 91 12/24/2018 0937   GFRAA 106 12/24/2018 0937   Lab Results  Component Value Date   WBC 6.5 12/24/2018   HGB 15.0 12/24/2018   HCT 43.7 12/24/2018   MCV 89.0 12/24/2018   PLT 214 12/24/2018    Imaging Studies: No results found.  Assessment and Plan:   Chris Baldwin is a 45 y.o. y/o male here for evaluation of right lower quadrant abdominal pain  Abdominal exam is very benign I suspect that his discomfort may be due to prolonged procedure time, and needing position changes and abdominal pressure during the exam.  He may have musculoskeletal pain  I have asked him to use ice pack 3 times a day.  Symptoms are already improving  I will obtain abdominal x-ray for further evaluation  If any abnormalities present obtain CT next If symptoms do not resolve or worsen, obtain CT  Patient has bought over-the-counter stool softeners that he plans on taking if he does not have a bowel movement soon.  I have encouraged him to take one today after x-rays results are available if he does not have a bowel movement today   Dr Vonda Antigua

## 2020-12-30 NOTE — Telephone Encounter (Signed)
PCP has already responded to previous phone note.

## 2020-12-30 NOTE — Telephone Encounter (Signed)
Patient calling for the 3rd time waiting on call back.  Patient complaining of stomach pain, discomfort having a bowel movement, feels a bulging sensation since his procedure on 12/25/20.  Today patient complaining of constipation. Pease call back to advise

## 2020-12-31 NOTE — Telephone Encounter (Signed)
Patient was seen by Dr. Bonna Gains yesterday to he was able to have an x-ray done.

## 2021-01-15 ENCOUNTER — Other Ambulatory Visit
Admission: RE | Admit: 2021-01-15 | Discharge: 2021-01-15 | Disposition: A | Discharge status: Home / Self Care | Payer: BC Managed Care – PPO | Source: Ambulatory Visit | Attending: Infectious Diseases | Admitting: Infectious Diseases

## 2021-01-15 DIAGNOSIS — K2970 Gastritis, unspecified, without bleeding: Secondary | ICD-10-CM | POA: Insufficient documentation

## 2021-01-15 DIAGNOSIS — R079 Chest pain, unspecified: Secondary | ICD-10-CM | POA: Diagnosis not present

## 2021-01-15 DIAGNOSIS — K209 Esophagitis, unspecified without bleeding: Secondary | ICD-10-CM | POA: Diagnosis present

## 2021-01-15 LAB — TROPONIN I (HIGH SENSITIVITY): Troponin I (High Sensitivity): 3 ng/L (ref <18)

## 2021-01-26 ENCOUNTER — Telehealth: Payer: Self-pay

## 2021-01-26 DIAGNOSIS — Z800 Family history of malignant neoplasm of digestive organs: Secondary | ICD-10-CM

## 2021-01-26 NOTE — Telephone Encounter (Signed)
-----   Message from Shelby Mattocks, [place] sent at 12/30/2020 10:55 AM EST -----  ----- Message ----- From: Virgel Manifold, MD Sent: 12/30/2020  10:54 AM EST To: Shelby Mattocks, CMA  Chris Baldwin,  1. this patient needs to be set up for a virtual/CT colonoscopy for colon cancer screening as his colonoscopy recently done was incomplete.  Please schedule the patient.  2.  Please set recall colonoscopy for 5 years  3.  If he is continuing to have upper GI symptoms, such as reflux, indigestion, heartburn, epigastric pain, please change his Pepcid to omeprazole 20 mg once daily for 30 days  4.  Patient also needs to follow-up with me in clinic for his history of fatty liver. Please schedule clinic appointment 2 to 4 weeks after CT colonoscopy

## 2021-01-26 NOTE — Telephone Encounter (Signed)
Called patient but had to leave him a voicemail to call me back. 

## 2021-01-26 NOTE — Telephone Encounter (Signed)
Patient called back, asks for call back. I explained to patient that nurses were in clinic and would return phone calls as soon as possible.

## 2021-01-26 NOTE — Telephone Encounter (Signed)
Patient called and is confused about messages he has received.  Patient asked for call back to explain what he needs next.

## 2021-02-22 ENCOUNTER — Inpatient Hospital Stay: Admission: RE | Admit: 2021-02-22 | Payer: BC Managed Care – PPO | Source: Ambulatory Visit

## 2021-03-10 ENCOUNTER — Ambulatory Visit
Admission: RE | Admit: 2021-03-10 | Discharge: 2021-03-10 | Disposition: A | Discharge status: Home / Self Care | Payer: BC Managed Care – PPO | Source: Ambulatory Visit | Attending: Gastroenterology | Admitting: Gastroenterology

## 2021-03-10 DIAGNOSIS — Z800 Family history of malignant neoplasm of digestive organs: Secondary | ICD-10-CM

## 2021-10-26 ENCOUNTER — Other Ambulatory Visit
Admission: RE | Admit: 2021-10-26 | Discharge: 2021-10-26 | Disposition: A | Discharge status: Home / Self Care | Payer: BC Managed Care – PPO | Source: Ambulatory Visit | Attending: Infectious Diseases | Admitting: Infectious Diseases

## 2021-10-26 DIAGNOSIS — R1084 Generalized abdominal pain: Secondary | ICD-10-CM | POA: Insufficient documentation

## 2021-10-26 DIAGNOSIS — M5412 Radiculopathy, cervical region: Secondary | ICD-10-CM | POA: Insufficient documentation

## 2021-10-26 DIAGNOSIS — E669 Obesity, unspecified: Secondary | ICD-10-CM | POA: Insufficient documentation

## 2021-10-26 LAB — TROPONIN I (HIGH SENSITIVITY): Troponin I (High Sensitivity): 3 ng/L (ref <18)

## 2022-06-22 ENCOUNTER — Ambulatory Visit (INDEPENDENT_AMBULATORY_CARE_PROVIDER_SITE_OTHER): Payer: BC Managed Care – PPO

## 2022-06-22 ENCOUNTER — Encounter: Payer: Self-pay | Admitting: Emergency Medicine

## 2022-06-22 ENCOUNTER — Other Ambulatory Visit: Payer: Self-pay

## 2022-06-22 ENCOUNTER — Emergency Department: Payer: BC Managed Care – PPO

## 2022-06-22 ENCOUNTER — Ambulatory Visit
Admission: EM | Admit: 2022-06-22 | Discharge: 2022-06-22 | Disposition: A | Discharge status: Home / Self Care | Payer: BC Managed Care – PPO | Attending: Family Medicine | Admitting: Family Medicine

## 2022-06-22 DIAGNOSIS — R42 Dizziness and giddiness: Secondary | ICD-10-CM | POA: Insufficient documentation

## 2022-06-22 DIAGNOSIS — M5412 Radiculopathy, cervical region: Secondary | ICD-10-CM | POA: Diagnosis not present

## 2022-06-22 DIAGNOSIS — R110 Nausea: Secondary | ICD-10-CM | POA: Diagnosis not present

## 2022-06-22 DIAGNOSIS — R0602 Shortness of breath: Secondary | ICD-10-CM | POA: Insufficient documentation

## 2022-06-22 DIAGNOSIS — H6123 Impacted cerumen, bilateral: Secondary | ICD-10-CM | POA: Diagnosis not present

## 2022-06-22 DIAGNOSIS — H6692 Otitis media, unspecified, left ear: Secondary | ICD-10-CM

## 2022-06-22 DIAGNOSIS — M542 Cervicalgia: Secondary | ICD-10-CM | POA: Diagnosis not present

## 2022-06-22 DIAGNOSIS — R61 Generalized hyperhidrosis: Secondary | ICD-10-CM | POA: Diagnosis not present

## 2022-06-22 DIAGNOSIS — R202 Paresthesia of skin: Secondary | ICD-10-CM

## 2022-06-22 LAB — CBC
HCT: 48.3 % (ref 39.0–52.0)
Hemoglobin: 16.4 g/dL (ref 13.0–17.0)
MCH: 30.7 pg (ref 26.0–34.0)
MCHC: 34 g/dL (ref 30.0–36.0)
MCV: 90.4 fL (ref 80.0–100.0)
Platelets: 224 10*3/uL (ref 150–400)
RBC: 5.34 MIL/uL (ref 4.22–5.81)
RDW: 13.2 % (ref 11.5–15.5)
WBC: 9.4 10*3/uL (ref 4.0–10.5)
nRBC: 0 % (ref 0.0–0.2)

## 2022-06-22 LAB — BASIC METABOLIC PANEL
Anion gap: 7 (ref 5–15)
BUN: 12 mg/dL (ref 6–20)
CO2: 27 mmol/L (ref 22–32)
Calcium: 9.4 mg/dL (ref 8.9–10.3)
Chloride: 104 mmol/L (ref 98–111)
Creatinine, Ser: 1.18 mg/dL (ref 0.61–1.24)
GFR, Estimated: >60 mL/min (ref >60)
Glucose, Bld: 124 mg/dL — ABNORMAL HIGH (ref 70–99)
Potassium: 4.2 mmol/L (ref 3.5–5.1)
Sodium: 138 mmol/L (ref 135–145)

## 2022-06-22 LAB — TROPONIN I (HIGH SENSITIVITY)
Troponin I (High Sensitivity): 3 ng/L (ref <18)
Troponin I (High Sensitivity): 3 ng/L (ref <18)

## 2022-06-22 MED ORDER — PREDNISONE 10 MG (21) PO TBPK: ORAL_TABLET | Freq: Every day | ORAL | 0 refills | Status: DC

## 2022-06-22 MED ORDER — AMOXICILLIN-POT CLAVULANATE 875-125 MG PO TABS: 1.0000 | ORAL_TABLET | Freq: Two times a day (BID) | ORAL | 0 refills | Status: AC

## 2022-06-22 MED ORDER — METHOCARBAMOL 500 MG PO TABS: 500.0000 mg | ORAL_TABLET | Freq: Three times a day (TID) | ORAL | 0 refills | Status: DC | PRN

## 2022-06-22 NOTE — Discharge Instructions (Addendum)
Advised/informed patient of cervical spine x-ray results with hard copy provided to patient.  Advised patient to take medication as directed with food to completion.  Advised patient to take prednisone with first dose of Augmentin for the next 10 days.  Advised may use Robaxin as needed for accompanying muscle spasms of posterior neck.  Encouraged patient increase daily water intake while taking these medications.  Advised patient if symptoms worsen and/or unresolved please follow-up with PCP or here for further evaluation.

## 2022-06-22 NOTE — ED Triage Notes (Signed)
Lower rt abdominal pain started on Monday got better on Tuesday, today had numbness in feet, fingers and face, felt light headed, hot flashes and cold sweats today. Had negative Covid test yesterday.

## 2022-06-22 NOTE — ED Provider Notes (Signed)
[facility]    CSN: [id number] Arrival date & time: 06/22/22  1235      History   Chief Complaint No chief complaint on file.   HPI Chris Baldwin is a 46 y.o. male.   HPI 46 year old male presents with a right lower abdominal pain that started 2 days ago and got better 1 day later.  Reports numbness to feet, fingers and face and felt lightheaded hot flashes and cold sweats today.  Patient reports negative home COVID-19 test yesterday.  PMH significant for morbid obesity, acute gastric erosion GERD, and sleep apnea.  Past Medical History:  Diagnosis Date   Allergy    GERD (gastroesophageal reflux disease)    Sleep apnea     Patient Active Problem List   Diagnosis Date Noted   Acute gastric erosion    Family history of colon cancer    Polyp of transverse colon    Polyp of sigmoid colon    Aortic atherosclerosis ([place]) 12/24/2019   Insomnia 01/21/2019   Obesity (BMI 35.0-39.9 without comorbidity) 03/01/2018   Low HDL (under 40) 03/01/2018   Fatty liver 03/01/2018   GAD (generalized anxiety disorder) 09/11/2017   Vitamin D deficiency 07/05/2016   Obstructive sleep apnea 01/18/2016   Low testosterone 08/25/2015   Gastroesophageal reflux disease 08/18/2014    Past Surgical History:  Procedure Laterality Date   APPENDECTOMY     in young adult years   COLONOSCOPY WITH PROPOFOL N/A 04/27/2016   Procedure: COLONOSCOPY WITH PROPOFOL;  Surgeon: Manya Silvas, MD;  Location: [place];  Service: Endoscopy;  Laterality: N/A;   COLONOSCOPY WITH PROPOFOL N/A 12/25/2020   Procedure: COLONOSCOPY WITH PROPOFOL;  Surgeon: Virgel Manifold, MD;  Location: [facility];  Service: Endoscopy;  Laterality: N/A;   ESOPHAGOGASTRODUODENOSCOPY (EGD) WITH PROPOFOL N/A 12/25/2020   Procedure: ESOPHAGOGASTRODUODENOSCOPY (EGD) WITH PROPOFOL;  Surgeon: Virgel Manifold, MD;  Location: [facility];  Service: Endoscopy;  Laterality: N/A;   SURGERY SCROTAL /  TESTICULAR     young adult years   TONSILLECTOMY Bilateral    in childhood       Home Medications    Prior to Admission medications   Medication Sig Start Date End Date Taking? Authorizing Provider  amoxicillin-clavulanate (AUGMENTIN) 875-125 MG tablet Take 1 tablet by mouth 2 (two) times daily for 7 days. 06/22/22 06/29/22 Yes Eliezer Lofts, FNP  methocarbamol (ROBAXIN) 500 MG tablet Take 1 tablet (500 mg total) by mouth 3 (three) times daily as needed for muscle spasms. 06/22/22  Yes Eliezer Lofts, FNP  predniSONE (STERAPRED UNI-PAK 21 TAB) 10 MG (21) TBPK tablet Take by mouth daily. Take 6 tabs by mouth daily  for 2 days, then 5 tabs for 2 days, then 4 tabs for 2 days, then 3 tabs for 2 days, 2 tabs for 2 days, then 1 tab by mouth daily for 2 days 06/22/22  Yes Eliezer Lofts, FNP  testosterone enanthate (DELATESTRYL) 200 MG/ML injection Inject into the muscle every 14 (fourteen) days. For IM use only   Yes [provider]  cetirizine (ZYRTEC) 10 MG tablet Take 10 mg daily by mouth.    [provider]  famotidine (PEPCID) 40 MG tablet Take 40 mg by mouth 2 (two) times daily. 10/06/20   [provider]  fluticasone (FLONASE) 50 MCG/ACT nasal spray Place 2 sprays into both nostrils daily. 03/01/18   Arnetha Courser, MD    Family History Family History  Problem Relation Age of Onset  Heart disease Mother    Diabetes Mother    Cancer Mother        colon   Arthritis Mother    Heart disease Father    Arthritis Father    Alcohol abuse Maternal Uncle     Social History Social History   Tobacco Use   Smoking status: Former    Years: 1.00    Types: Cigarettes    Quit date: 02/19/2013    Years since quitting: 9.3   Smokeless tobacco: Never  Vaping Use   Vaping Use: Never used  Substance Use Topics   Alcohol use: Yes    Alcohol/week: 0.0 - 1.0 standard drinks of alcohol    Comment: occasional   Drug use: Not Currently     Allergies   Nexium  [esomeprazole magnesium]   Review of Systems Review of Systems  Gastrointestinal:  Positive for abdominal pain.  Neurological:  Positive for numbness.  All other systems reviewed and are negative.    Physical Exam Triage Vital Signs ED Triage Vitals  Enc Vitals Group     BP 06/22/22 1305 134/87     Pulse Rate 06/22/22 1305 63     Resp 06/22/22 1305 16     Temp 06/22/22 1305 98.6 F (37 C)     Temp Source 06/22/22 1305 Oral     SpO2 06/22/22 1305 98 %     Weight 06/22/22 1306 (!) 308 lb (139.7 kg)     Height 06/22/22 1306 '6\' 5"'$  (1.956 m)     Head Circumference --      Peak Flow --      Pain Score 06/22/22 1305 0     Pain Loc --      Pain Edu? --      Excl. in [place]? --    No data found.  Updated Vital Signs BP 134/87 (BP Location: Left Arm)   Pulse 63   Temp 98.6 F (37 C) (Oral)   Resp 16   Ht '6\' 5"'$  (1.956 m)   Wt (!) 308 lb (139.7 kg)   SpO2 98%   BMI 36.52 kg/m    Physical Exam Vitals and nursing note reviewed.  Constitutional:      General: He is not in acute distress.    Appearance: He is obese. He is not ill-appearing.  HENT:     Head: Normocephalic and atraumatic.     Right Ear: External ear normal.     Left Ear: External ear normal.     Ears:     Comments: Bilateral EAC's occluded with cerumen unable to visualize either TM; post bilateral ear lavage-EAC's are clear; Left TM: Red rimmed, retracted; Right TM: Cloudy, retracted    Mouth/Throat:     Mouth: Mucous membranes are moist.     Pharynx: Oropharynx is clear.  Eyes:     Extraocular Movements: Extraocular movements intact.     Conjunctiva/sclera: Conjunctivae normal.     Pupils: Pupils are equal, round, and reactive to light.  Cardiovascular:     Rate and Rhythm: Normal rate and regular rhythm.     Pulses: Normal pulses.     Heart sounds: Normal heart sounds. No murmur heard. Pulmonary:     Effort: Pulmonary effort is normal.     Breath sounds: Normal breath sounds. No wheezing, rhonchi or  rales.  Musculoskeletal:        General: Normal range of motion.     Cervical back: Normal range of motion and neck supple.  No tenderness.     Comments: Cervical spine (inferior posterior aspect): TTP over spinous processes(C4-C5, C5-C6); LROM 4 planes of movement; palpable muscle adhesions of posterior splenius muscles noted bilaterally  Lymphadenopathy:     Cervical: No cervical adenopathy.  Skin:    General: Skin is warm and dry.  Neurological:     General: No focal deficit present.     Mental Status: He is alert and oriented to person, place, and time. Mental status is at baseline.     Cranial Nerves: No cranial nerve deficit.     Sensory: No sensory deficit.     Motor: No weakness.     Gait: Gait normal.      UC Treatments / Results  Labs (all labs ordered are listed, but only abnormal results are displayed) Labs Reviewed - No data to display  EKG   Radiology DG Cervical Spine Complete  Result Date: 06/22/2022 CLINICAL DATA:  A 46 year old male presents with cervical radiculopathy. Pain in posterior neck and numbness in both hands without history of injury. EXAM: CERVICAL SPINE - COMPLETE 4+ VIEW COMPARISON:  None available FINDINGS: Prevertebral soft tissues are normal. Cervical spine evaluated from the skull base through the top of T1 on lateral view with swimmer's views showing no malalignment at the cervicothoracic junction. Mild straightening of normal cervical lordosis with evidence of degenerative change which is greatest at C5-6 and C6-7, associated with moderate disc space narrowing and early osteophyte formation. Mild uncovertebral spurring at C4-5 and C5-6 with mild neural foraminal narrowing potentially at these levels on the LEFT, difficult to assess on the RIGHT. No sign of acute abnormality. IMPRESSION: Degenerative changes throughout the cervical spine without static malalignment and with mild straightening of cervical lordotic curvature. Degenerative changes  greatest in the lower cervical spine with perhaps mild neural foraminal narrowing at C4-5 and C5-6 bilaterally but assessment limited on radiography with respect to RIGHT-sided neural foramina. Electronically Signed   By: Zetta Bills M.D.   On: 06/22/2022 13:52    Procedures Procedures (including critical care time)  Medications Ordered in UC Medications - No data to display  Initial Impression / Assessment and Plan / UC Course  I have reviewed the triage vital signs and the nursing notes.  Pertinent labs & imaging results that were available during my care of the patient were reviewed by me and considered in my medical decision making (see chart for details).     MDM: 1.  Cervical radiculopathy-neuro within normal limits on exam, cervical spine x-ray revealed above, Rx'd Sterapred Unipak, Robaxin; 2.  Bilateral cerumen impaction-resolved with bilateral ear lavage; 3.  Acute left otitis media-Rx'd Augmentin. Advised/informed patient of cervical spine x-ray results with hard copy provided to patient.  Advised patient to take medication as directed with food to completion.  Advised patient to take prednisone with first dose of Augmentin for the next 10 days.  Advised may use Robaxin as needed for accompanying muscle spasms of posterior neck.  Encouraged patient increase daily water intake while taking these medications.  Advised patient if symptoms worsen and/or unresolved please follow-up with PCP or here for further evaluation.  Work note provided to patient prior to discharge.  Patient discharged home, hemodynamically stable. Final Clinical Impressions(s) / UC Diagnoses   Final diagnoses:  Cervical radiculopathy  Bilateral impacted cerumen  Acute left otitis media     Discharge Instructions      Advised/informed patient of cervical spine x-ray results with hard copy provided to patient.  Advised patient to take medication as directed with food to completion.  Advised patient to take  prednisone with first dose of Augmentin for the next 10 days.  Advised may use Robaxin as needed for accompanying muscle spasms of posterior neck.  Encouraged patient increase daily water intake while taking these medications.  Advised patient if symptoms worsen and/or unresolved please follow-up with PCP or here for further evaluation.     ED Prescriptions     Medication Sig Dispense Auth. Provider   predniSONE (STERAPRED UNI-PAK 21 TAB) 10 MG (21) TBPK tablet Take by mouth daily. Take 6 tabs by mouth daily  for 2 days, then 5 tabs for 2 days, then 4 tabs for 2 days, then 3 tabs for 2 days, 2 tabs for 2 days, then 1 tab by mouth daily for 2 days 42 tablet Eliezer Lofts, FNP   amoxicillin-clavulanate (AUGMENTIN) 875-125 MG tablet Take 1 tablet by mouth 2 (two) times daily for 7 days. 14 tablet Eliezer Lofts, FNP   methocarbamol (ROBAXIN) 500 MG tablet Take 1 tablet (500 mg total) by mouth 3 (three) times daily as needed for muscle spasms. 30 tablet Eliezer Lofts, FNP      PDMP not reviewed this encounter.   Eliezer Lofts, [place] 06/22/22 1429

## 2022-06-22 NOTE — ED Triage Notes (Signed)
Pt presents via POV with c/o shob, dizziness, and diaphoresis. Pt also endorses left sided arm numbness and bilateral feet numbness that began earlier today, but unsure what time. It is noted in previous urgent care note that pt had these symptoms today around 1pm. Pt denies headaches. Pt endorses blurred vision, but reports has been ongoing for a while. Pt reports he was started on prednisone and Augmentin today for bilateral ear infection. Pt reports dizziness is worse when she sits down with eyes closed.

## 2022-06-23 ENCOUNTER — Emergency Department
Admission: EM | Admit: 2022-06-23 | Discharge: 2022-06-23 | Disposition: A | Discharge status: Home / Self Care | Payer: BC Managed Care – PPO | Attending: Emergency Medicine | Admitting: Emergency Medicine

## 2022-06-23 ENCOUNTER — Encounter: Payer: Self-pay | Admitting: Emergency Medicine

## 2022-06-23 DIAGNOSIS — R42 Dizziness and giddiness: Secondary | ICD-10-CM

## 2022-06-23 NOTE — ED Provider Notes (Signed)
[facility] Provider Note    Event Date/Time   First MD Initiated Contact with Patient 06/23/22 [phone]     (approximate)   History   Shortness of Breath   HPI  Chris Baldwin is a 46 y.o. male who presents to the ED for evaluation of Shortness of Breath   I reviewed pulmonary clinic visit from 6/30.  Obese patient with history of OSA.  I review urgent care visit from about 12 hours ago where he was diagnosed with bilateral cerumen impaction, irrigated in the urgent care and then diagnosed with AOM on the left.  Prescribed Augmentin, Robaxin and prednisone.  Patient presents to the ED for evaluation of multiple episodic symptoms.  He reports primarily episodes of dizziness, diaphoresis and nausea.  Reports he has had 2 or 3 episodes of dizziness in the past couple days.  He reports primarily an episode of vertigo that occurred while he was seated this evening prior to arrival.  Reports this was after his cerumen disimpaction and starting the Augmentin/Robaxin/prednisone.  Reports 10 minutes of vertigo and diaphoresis that self resolved and has not recurred.  When asked to describe his other episodes of dizziness, he reports a pulsing sensation to his vision to bilateral/diffuse visual fields without presyncope or vertigo.  Denies any focal vision loss, weakness to the extremities.    Reports intermittent paresthesias to his bilateral hands and feet symmetrically.  Reports he chronically feels short of breath when he breathes through his nose and reports a chronically congested or occluded nose for which she has seen ENT in the past.   Denies any chest pain, headache, syncope or falls.  Denies any gait change or weakness to the extremities.  Reports tolerating p.o. intake and toileting at his baseline.  Physical Exam   Triage Vital Signs: ED Triage Vitals  Enc Vitals Group     BP 06/22/22 2137 (!) 150/90     Pulse Rate 06/22/22 2137 67     Resp  06/22/22 2314 20     Temp 06/22/22 2137 98.6 F (37 C)     Temp Source 06/22/22 2314 Oral     SpO2 06/22/22 2137 98 %     Weight --      Height --      Head Circumference --      Peak Flow --      Pain Score --      Pain Loc --      Pain Edu? --      Excl. in [place]? --     Most recent vital signs: Vitals:   06/22/22 2137 06/22/22 2314  BP: (!) 150/90 (!) 142/94  Pulse: 67 77  Resp:  20  Temp: 98.6 F (37 C)   SpO2: 98% 94%    General: Awake, no distress.  Ambulatory with normal gait.  Pleasant and conversational.  Look systemically well. CV:  Good peripheral perfusion.  Resp:  Normal effort.  Abd:  No distention.  MSK:  No deformity noted.  Neuro:  No focal deficits appreciated. Cranial nerves II through XII intact 5/5 strength and sensation in all 4 extremities Other:  Left TM is erythematous without signs of rupture.   ED Results / Procedures / Treatments   Labs (all labs ordered are listed, but only abnormal results are displayed) Labs Reviewed  BASIC METABOLIC PANEL - Abnormal; Notable for the following components:      Result Value   Glucose, Bld 124 (*)  All other components within normal limits  CBC  TROPONIN I (HIGH SENSITIVITY)  TROPONIN I (HIGH SENSITIVITY)    EKG Sinus rhythm with a rate of 84 bpm.  Normal axis .  Nonspecific ST changes to leads Baldwin and aVF without reciprocal changes or clear signs of acute ischemia.  RADIOLOGY CT head interpreted by me without evidence of acute intracranial pathology CXR interpreted by me without evidence of acute cardiopulmonary pathology.  Official radiology report(s): CT HEAD WO CONTRAST (5MM)  Result Date: 06/23/2022 CLINICAL DATA:  Left-sided numbness and dizziness EXAM: CT HEAD WITHOUT CONTRAST TECHNIQUE: Contiguous axial images were obtained from the base of the skull through the vertex without intravenous contrast. RADIATION DOSE REDUCTION: This exam was performed according to the departmental  dose-optimization program which includes automated exposure control, adjustment of the mA and/or kV according to patient size and/or use of iterative reconstruction technique. COMPARISON:  None Available. FINDINGS: Brain: No intracranial hemorrhage, mass effect, or evidence of acute infarct. No hydrocephalus. No extra-axial fluid collection. Vascular: No hyperdense vessel or unexpected calcification. Skull: No fracture or focal lesion. Sinuses/Orbits: No acute finding. Paranasal sinuses and mastoid air cells are well aerated. Other: None. IMPRESSION: Unremarkable noncontrast CT head. Electronically Signed   By: Placido Sou M.D.   On: 06/23/2022 00:08   DG Chest 2 View  Result Date: 06/22/2022 CLINICAL DATA:  Lightheaded with cold sweats. EXAM: CHEST - 2 VIEW COMPARISON:  October 19, 2015 FINDINGS: The heart size and mediastinal contours are within normal limits. Both lungs are clear. The visualized skeletal structures are unremarkable. IMPRESSION: No active cardiopulmonary disease. Electronically Signed   By: Virgina Norfolk M.D.   On: 06/22/2022 21:06   DG Cervical Spine Complete  Result Date: 06/22/2022 CLINICAL DATA:  A 46 year old male presents with cervical radiculopathy. Pain in posterior neck and numbness in both hands without history of injury. EXAM: CERVICAL SPINE - COMPLETE 4+ VIEW COMPARISON:  None available FINDINGS: Prevertebral soft tissues are normal. Cervical spine evaluated from the skull base through the top of T1 on lateral view with swimmer's views showing no malalignment at the cervicothoracic junction. Mild straightening of normal cervical lordosis with evidence of degenerative change which is greatest at C5-6 and C6-7, associated with moderate disc space narrowing and early osteophyte formation. Mild uncovertebral spurring at C4-5 and C5-6 with mild neural foraminal narrowing potentially at these levels on the LEFT, difficult to assess on the RIGHT. No sign of acute abnormality.  IMPRESSION: Degenerative changes throughout the cervical spine without static malalignment and with mild straightening of cervical lordotic curvature. Degenerative changes greatest in the lower cervical spine with perhaps mild neural foraminal narrowing at C4-5 and C5-6 bilaterally but assessment limited on radiography with respect to RIGHT-sided neural foramina. Electronically Signed   By: Zetta Bills M.D.   On: 06/22/2022 13:52    PROCEDURES and INTERVENTIONS:  Procedures  Medications - No data to display   IMPRESSION / MDM / [place] / ED COURSE  I reviewed the triage vital signs and the nursing notes.  Differential diagnosis includes, but is not limited to, stroke, TIA, PE, anxiety, BPPV, AOM, ACS  {Patient presents with symptoms of an acute illness or injury that is potentially life-threatening.  46 year old male presents to the ED with various intermittent dizzy symptoms, possibly related to his cerumen impaction and AOM, but ultimately suitable for outpatient management.  Looks systemically well with reassuring examination without signs of neurologic or vascular deficits.  Blood work is reassuring with  a normal CBC, metabolic panel and 2 negative troponins.  EKG is nonischemic, clear CT head and CXR.  As indicated below, we have a long discussion about possible etiologies of his symptoms and ultimately we decided upon treating his AOM with close return precautions without any CT angiograms.  He acknowledges the risks of undiagnosed pathology.  We discussed return precautions.  Clinical Course as of 06/23/22 0330  Thu Jun 23, 2022  0322 Long discussion regarding plan of care and shared decision making.  We discussed possibly performing 2 different studies, we discussed CTA chest to look for PE as well as a CTA neck/head to look for carotid stenosis that could be contributing to his symptoms.  He reports that he was just here to get blood work done and to get checked out but  really does not think he needs to do these.  After discussing risks and benefits extensively, we decided upon continued treatment of his AOM and close return precautions for the ED.  We discussed what to look out for at home and return precautions.  He is agreeable. [DS]    Clinical Course User Index [DS] Vladimir Crofts, MD     FINAL CLINICAL IMPRESSION(S) / ED DIAGNOSES   Final diagnoses:  Dizziness     Rx / DC Orders   ED Discharge Orders     None        Note:  This document was prepared using Dragon voice recognition software and may include unintentional dictation errors.   Vladimir Crofts, MD 06/23/22 0330

## 2022-06-23 NOTE — ED Notes (Signed)
E-signature not working at this time. Pt verbalized understanding of D/C instructions, prescriptions and follow up care with no further questions at this time. Pt in NAD and ambulatory at time of D/C.  

## 2022-06-23 NOTE — Discharge Instructions (Addendum)
Finish your treatment for the ear infections, but if you have any worsening symptoms in the meantime or symptoms do not go away then please return to the ED.

## 2022-10-05 NOTE — Discharge Instructions (Addendum)
[facility] ENDOSCOPIC SINUS SURGERY [facility]  What is Functional Endoscopic Sinus Surgery?  The Surgery involves making the natural openings of the sinuses larger by removing the bony partitions that separate the sinuses from the nasal cavity.  The natural sinus lining is preserved as much as possible to allow the sinuses to resume normal function after the surgery.  In some patients nasal polyps (excessively swollen lining of the sinuses) may be removed to relieve obstruction of the sinus openings.  The surgery is performed through the nose using lighted scopes, which eliminates the need for incisions on the face.  A septoplasty is a different procedure which is sometimes performed with sinus surgery.  It involves straightening the boy partition that separates the two sides of your nose.  A crooked or deviated septum may need repair if is obstructing the sinuses or nasal airflow.  Turbinate reduction is also often performed during sinus surgery.  The turbinates are bony proturberances from the side walls of the nose which swell and can obstruct the nose in patients with sinus and allergy problems.  Their size can be surgically reduced to help relieve nasal obstruction.  What Can Sinus Surgery Do For Me?  Sinus surgery can reduce the frequency of sinus infections requiring antibiotic treatment.  This can provide improvement in nasal congestion, post-nasal drainage, facial pressure and nasal obstruction.  Surgery will NOT prevent you from ever having an infection again, so it usually only for patients who get infections 4 or more times yearly requiring antibiotics, or for infections that do not clear with antibiotics.  It will not cure nasal allergies, so patients with allergies may still require medication to treat their allergies after surgery. Surgery may improve headaches related to sinusitis, however, some people will continue to  require medication to control sinus headaches related to allergies.  Surgery will do nothing for other forms of headache (migraine, tension or cluster).  What Are the Risks of Endoscopic Sinus Surgery?  Current techniques allow surgery to be performed safely with little risk, however, there are rare complications that patients should be aware of.  Because the sinuses are located around the eyes, there is risk of eye injury, including blindness, though again, this would be quite rare. This is usually a result of bleeding behind the eye during surgery, which can effect vision, though there are treatments to protect the vision and prevent permanent injury. More serious complications would include bleeding inside the brain cavity or damage to the brain.This happens when the fluid around the brain leaks out into the sinus cavity.  Again, all of these complications are uncommon, and spinal fluid leaks can be safely managed surgically if they occur.  The most common complication of sinus surgery is bleeding from the nose, which may require packing or cauterization of the nose.  Patients with polyps may experience recurrence of the polyps that would require revision surgery.  Alterations of sense of smell or injury to the tear ducts are also rare complications.   What is the Surgery Like, and what is the Recovery?  The Surgery usually takes a couple of hours to perform, and is usually performed under a general anesthetic (completely asleep).  Patients are usually discharged home after a couple of hours.  Sometimes during surgery it is necessary to pack the nose to control bleeding, and the packing is left in place for 24 - 48 hours, and removed by your surgeon.  If   a septoplasty was performed during the procedure, there is often a splint placed which must be removed after 5-7 days.   Discomfort: Pain is usually mild to moderate, and can be controlled by prescription pain medication or acetaminophen (Tylenol).   Aspirin, Ibuprofen (Advil, Motrin), or Naprosyn (Aleve) should be avoided, as they can cause increased bleeding.  Most patients feel sinus pressure like they have a bad head cold for several days.  Sleeping with your head elevated can help reduce swelling and facial pressure, as can ice packs over the face.  A humidifier may be helpful to keep the mucous and blood from drying in the nose.   Diet: There are no specific diet restrictions, however, you should generally start with clear liquids and a light diet of bland foods because the anesthetic can cause some nausea.  Advance your diet depending on how your stomach feels.  Taking your pain medication with food will often help reduce stomach upset which pain medications can cause.  Nasal Saline Irrigation: It is important to remove blood clots and dried mucous from the nose as it is healing.  This is done by having you irrigate the nose at least 3 - 4 times daily with a salt water solution.  We recommend using NeilMed Sinus Rinse (available at the drug store).  Fill the squeeze bottle with the solution, bend over a sink, and insert the tip of the squeeze bottle into the nose  of an inch.  Point the tip of the squeeze bottle towards the inside corner of the eye on the same side your irrigating.  Squeeze the bottle and gently irrigate the nose.  If you bend forward as you do this, most of the fluid will flow back out of the nose, instead of down your throat.   The solution should be warm, near body temperature, when you irrigate.   Each time you irrigate, you should use a full squeeze bottle.   Note that if you are instructed to use Nasal Steroid Sprays at any time after your surgery, irrigate with saline BEFORE using the steroid spray, so you do not wash it all out of the nose. Another product, Nasal Saline Gel (such as AYR Nasal Saline Gel) can be applied in each nostril 3 - 4 times daily to moisture the nose and reduce scabbing or crusting.  Bleeding:   Bloody drainage from the nose can be expected for several days, and patients are instructed to irrigate their nose frequently with salt water to help remove mucous and blood clots.  The drainage may be dark red or brown, though some fresh blood may be seen intermittently, especially after irrigation.  Do not blow you nose, as bleeding may occur. If you must sneeze, keep your mouth open to allow air to escape through your mouth.  If heavy bleeding occurs: Irrigate the nose with saline to rinse out clots, then spray the nose 3 - 4 times with Afrin Nasal Decongestant Spray.  The spray will constrict the blood vessels to slow bleeding.  Pinch the lower half of your nose shut to apply pressure, and lay down with your head elevated.  Ice packs over the nose may help as well. If bleeding persists despite these measures, you should notify your doctor.  Do not use the Afrin routinely to control nasal congestion after surgery, as it can result in worsening congestion and may affect healing.     Activity: Return to work varies among patients. Most patients will be out   of work at least 5 - 7 days to recover.  Patient may return to work after they are off of narcotic pain medication, and feeling well enough to perform the functions of their job.  Patients must avoid heavy lifting (over 10 pounds) or strenuous physical for 2 weeks after surgery, so your employer may need to assign you to light duty, or keep you out of work longer if light duty is not possible.  NOTE: you should not drive, operate dangerous machinery, do any mentally demanding tasks or make any important legal or financial decisions while on narcotic pain medication and recovering from the general anesthetic.    Call Your Doctor Immediately if You Have Any of the Following: Bleeding that you cannot control with the above measures Loss of vision, double vision, bulging of the eye or black eyes. Fever over 101 degrees Neck stiffness with severe headache,  fever, nausea and change in mental state. You are always encouraged to call anytime with concerns, however, please call with requests for pain medication refills during office hours.  Office Endoscopy: During follow-up visits your doctor will remove any packing or splints that may have been placed and evaluate and clean your sinuses endoscopically.  Topical anesthetic will be used to make this as comfortable as possible, though you may want to take your pain medication prior to the visit.  How often this will need to be done varies from patient to patient.  After complete recovery from the surgery, you may need follow-up endoscopy from time to time, particularly if there is concern of recurrent infection or nasal polyps.  

## 2022-10-06 ENCOUNTER — Encounter: Payer: Self-pay | Admitting: Otolaryngology

## 2022-10-06 ENCOUNTER — Other Ambulatory Visit: Payer: Self-pay

## 2022-10-11 DIAGNOSIS — G4733 Obstructive sleep apnea (adult) (pediatric): Secondary | ICD-10-CM | POA: Diagnosis not present

## 2022-10-13 ENCOUNTER — Encounter
Admission: RE | Disposition: A | Discharge status: Home / Self Care | Payer: Self-pay | Source: Home / Self Care | Attending: Otolaryngology

## 2022-10-13 ENCOUNTER — Ambulatory Visit: Payer: BC Managed Care – PPO | Admitting: Anesthesiology

## 2022-10-13 ENCOUNTER — Encounter: Payer: Self-pay | Admitting: Otolaryngology

## 2022-10-13 ENCOUNTER — Other Ambulatory Visit: Payer: Self-pay

## 2022-10-13 ENCOUNTER — Ambulatory Visit
Admission: RE | Admit: 2022-10-13 | Discharge: 2022-10-13 | Disposition: A | Discharge status: Home / Self Care | Payer: BC Managed Care – PPO | Attending: Otolaryngology | Admitting: Otolaryngology

## 2022-10-13 DIAGNOSIS — F419 Anxiety disorder, unspecified: Secondary | ICD-10-CM | POA: Insufficient documentation

## 2022-10-13 DIAGNOSIS — Z87891 Personal history of nicotine dependence: Secondary | ICD-10-CM | POA: Insufficient documentation

## 2022-10-13 DIAGNOSIS — Z9989 Dependence on other enabling machines and devices: Secondary | ICD-10-CM | POA: Diagnosis not present

## 2022-10-13 DIAGNOSIS — K219 Gastro-esophageal reflux disease without esophagitis: Secondary | ICD-10-CM | POA: Insufficient documentation

## 2022-10-13 DIAGNOSIS — G4730 Sleep apnea, unspecified: Secondary | ICD-10-CM | POA: Insufficient documentation

## 2022-10-13 DIAGNOSIS — J342 Deviated nasal septum: Secondary | ICD-10-CM | POA: Diagnosis not present

## 2022-10-13 DIAGNOSIS — Z79899 Other long term (current) drug therapy: Secondary | ICD-10-CM | POA: Insufficient documentation

## 2022-10-13 DIAGNOSIS — J329 Chronic sinusitis, unspecified: Secondary | ICD-10-CM | POA: Diagnosis not present

## 2022-10-13 DIAGNOSIS — J343 Hypertrophy of nasal turbinates: Secondary | ICD-10-CM | POA: Insufficient documentation

## 2022-10-13 HISTORY — PX: NASAL SEPTOPLASTY W/ TURBINOPLASTY: SHX2070

## 2022-10-13 SURGERY — SEPTOPLASTY, NOSE, WITH NASAL TURBINATE REDUCTION
Anesthesia: General | Site: Nose | Laterality: Bilateral

## 2022-10-13 MED ORDER — DEXTROSE 5 % IV SOLN
2000.0000 mg | Freq: Once | INTRAVENOUS | Status: AC
  Administered 2022-10-13: 2 g via INTRAVENOUS

## 2022-10-13 MED ORDER — OXYCODONE HCL 5 MG/5ML PO SOLN: 5.0000 mg | Freq: Once | ORAL | Status: AC | PRN

## 2022-10-13 MED ORDER — PROPOFOL 10 MG/ML IV BOLUS
INTRAVENOUS | Status: DC | PRN
  Administered 2022-10-13: 300 mg via INTRAVENOUS

## 2022-10-13 MED ORDER — MIDAZOLAM HCL 5 MG/5ML IJ SOLN
INTRAMUSCULAR | Status: DC | PRN
  Administered 2022-10-13 (×2): 1 mg via INTRAVENOUS

## 2022-10-13 MED ORDER — OXYCODONE HCL 5 MG PO TABS
5.0000 mg | ORAL_TABLET | Freq: Once | ORAL | Status: AC | PRN
  Administered 2022-10-13: 5 mg via ORAL

## 2022-10-13 MED ORDER — PREDNISONE 10 MG PO TABS: ORAL_TABLET | ORAL | 0 refills | Status: DC

## 2022-10-13 MED ORDER — ACETAMINOPHEN 500 MG PO TABS
1000.0000 mg | ORAL_TABLET | Freq: Once | ORAL | Status: AC
  Administered 2022-10-13: 1000 mg via ORAL

## 2022-10-13 MED ORDER — OXYMETAZOLINE HCL 0.05 % NA SOLN
2.0000 | Freq: Once | NASAL | Status: AC
  Administered 2022-10-13: 2 via NASAL

## 2022-10-13 MED ORDER — CEPHALEXIN 500 MG PO CAPS: 500.0000 mg | ORAL_CAPSULE | Freq: Two times a day (BID) | ORAL | 0 refills | Status: DC

## 2022-10-13 MED ORDER — ACETAMINOPHEN 10 MG/ML IV SOLN: 1000.0000 mg | Freq: Once | INTRAVENOUS | Status: DC | PRN

## 2022-10-13 MED ORDER — ACETAMINOPHEN 160 MG/5ML PO SOLN: 960.0000 mg | Freq: Once | ORAL | Status: AC

## 2022-10-13 MED ORDER — ONDANSETRON HCL 4 MG/2ML IJ SOLN: 4.0000 mg | Freq: Once | INTRAMUSCULAR | Status: DC | PRN

## 2022-10-13 MED ORDER — FENTANYL CITRATE PF 50 MCG/ML IJ SOSY: 25.0000 ug | PREFILLED_SYRINGE | INTRAMUSCULAR | Status: DC | PRN

## 2022-10-13 MED ORDER — DEXMEDETOMIDINE HCL IN NACL 80 MCG/20ML IV SOLN
INTRAVENOUS | Status: DC | PRN
  Administered 2022-10-13: 4 ug via BUCCAL

## 2022-10-13 MED ORDER — PHENYLEPHRINE HCL 0.5 % NA SOLN
NASAL | Status: DC | PRN
  Administered 2022-10-13: 20 mL via TOPICAL

## 2022-10-13 MED ORDER — LIDOCAINE-EPINEPHRINE 1 %-1:100000 IJ SOLN
INTRAMUSCULAR | Status: DC | PRN
  Administered 2022-10-13: 8 mL

## 2022-10-13 MED ORDER — SUCCINYLCHOLINE CHLORIDE 200 MG/10ML IV SOSY
PREFILLED_SYRINGE | INTRAVENOUS | Status: DC | PRN
  Administered 2022-10-13: 160 mg via INTRAVENOUS

## 2022-10-13 MED ORDER — FENTANYL CITRATE (PF) 100 MCG/2ML IJ SOLN
INTRAMUSCULAR | Status: DC | PRN
  Administered 2022-10-13: 100 ug via INTRAVENOUS

## 2022-10-13 MED ORDER — HYDROCODONE-ACETAMINOPHEN 5-325 MG PO TABS: 1.0000 | ORAL_TABLET | Freq: Four times a day (QID) | ORAL | 0 refills | Status: AC | PRN

## 2022-10-13 MED ORDER — DEXAMETHASONE SODIUM PHOSPHATE 4 MG/ML IJ SOLN
INTRAMUSCULAR | Status: DC | PRN
  Administered 2022-10-13: 10 mg via INTRAVENOUS

## 2022-10-13 MED ORDER — LACTATED RINGERS IV SOLN: INTRAVENOUS | Status: DC

## 2022-10-13 MED ORDER — ONDANSETRON HCL 4 MG/2ML IJ SOLN
INTRAMUSCULAR | Status: DC | PRN
  Administered 2022-10-13: 4 mg via INTRAVENOUS

## 2022-10-13 MED ORDER — LIDOCAINE HCL (CARDIAC) PF 100 MG/5ML IV SOSY
PREFILLED_SYRINGE | INTRAVENOUS | Status: DC | PRN
  Administered 2022-10-13: 100 mg via INTRAVENOUS

## 2022-10-13 MED ORDER — ACETAMINOPHEN 10 MG/ML IV SOLN
INTRAVENOUS | Status: DC | PRN
  Administered 2022-10-13: 1000 mg via INTRAVENOUS

## 2022-10-13 MED ORDER — KETOROLAC TROMETHAMINE 15 MG/ML IJ SOLN
INTRAMUSCULAR | Status: DC | PRN
  Administered 2022-10-13: 30 mg via INTRAVENOUS

## 2022-10-13 SURGICAL SUPPLY — 23 items
CANISTER SUCT 1200ML W/VALVE (MISCELLANEOUS) ×1 IMPLANT
COAGULATOR SUCT 8FR VV (MISCELLANEOUS) ×1 IMPLANT
ELECT REM PT RETURN 9FT ADLT (ELECTROSURGICAL) ×1
ELECTRODE REM PT RTRN 9FT ADLT (ELECTROSURGICAL) ×1 IMPLANT
GLOVE SURG GAMMEX PI TX LF 7.5 (GLOVE) ×2 IMPLANT
GOWN STRL REUS W/ TWL LRG LVL3 (GOWN DISPOSABLE) ×1 IMPLANT
GOWN STRL REUS W/TWL LRG LVL3 (GOWN DISPOSABLE) ×1
KIT TURNOVER KIT A (KITS) ×1 IMPLANT
NDL HYPO 27GX1-1/4 (NEEDLE) ×1 IMPLANT
NEEDLE HYPO 27GX1-1/4 (NEEDLE) ×1 IMPLANT
PACK ENT CUSTOM (PACKS) ×1 IMPLANT
PATTIES SURGICAL .5 X3 (DISPOSABLE) ×1 IMPLANT
SOL ANTI-FOG 6CC FOG-OUT (MISCELLANEOUS) ×1 IMPLANT
SPLINT NASAL SEPTAL BLV .50 ST (MISCELLANEOUS) ×1 IMPLANT
STRAP BODY AND KNEE 60X3 (MISCELLANEOUS) ×1 IMPLANT
SUT CHROMIC 3-0 (SUTURE) ×1
SUT CHROMIC 3-0 KS 27XMFL CR (SUTURE) ×1
SUT ETHILON 3-0 KS 30 BLK (SUTURE) ×1 IMPLANT
SUT PLAIN GUT 4-0 (SUTURE) ×1 IMPLANT
SUTURE CHRMC 3-0 KS 27XMFL CR (SUTURE) ×1 IMPLANT
SYR 3ML LL SCALE MARK (SYRINGE) ×1 IMPLANT
TOWEL OR 17X26 4PK STRL BLUE (TOWEL DISPOSABLE) ×1 IMPLANT
WATER STERILE IRR 250ML POUR (IV SOLUTION) ×1 IMPLANT

## 2022-10-13 NOTE — Anesthesia Postprocedure Evaluation (Signed)
Anesthesia Post Note  Patient: Chris Baldwin  Procedure(s) Performed: NASAL SEPTOPLASTY WITH INFERIOR TURBINATE REDUCTION (Bilateral: Nose)  Patient location during evaluation: PACU Anesthesia Type: General Level of consciousness: awake and alert Pain management: pain level controlled Vital Signs Assessment: post-procedure vital signs reviewed and stable Respiratory status: spontaneous breathing, nonlabored ventilation, respiratory function stable and patient connected to nasal cannula oxygen Cardiovascular status: blood pressure returned to baseline and stable Postop Assessment: no apparent nausea or vomiting Anesthetic complications: yes   Encounter Notable Events  Notable Event Outcome Phase Comment  Difficult to intubate - expected N/A Intraprocedure Filed from anesthesia note documentation.     Last Vitals:  Vitals:   10/13/22 1109 10/13/22 1153  BP: 112/75 127/73  Pulse:  69  Resp: 18 16  Temp: (!) 36.4 C 36.5 C  SpO2:  97%    Last Pain:  Vitals:   10/13/22 1109  PainSc: 0-No pain                 Arita Miss

## 2022-10-13 NOTE — Anesthesia Procedure Notes (Signed)
Procedure Name: Intubation Date/Time: 10/13/2022 10:07 AM  Performed by: Patience Musca., CRNAPre-anesthesia Checklist: Patient identified, Patient being monitored, Timeout performed, Emergency Drugs available and Suction available Patient Re-evaluated:Patient Re-evaluated prior to induction Oxygen Delivery Method: Circle system utilized Preoxygenation: Pre-oxygenation with 100% oxygen Induction Type: IV induction Ventilation: Mask ventilation with difficulty and Two handed mask ventilation required Laryngoscope Size: 4 and McGraph Grade View: Grade I Tube type: Oral Tube size: 7.5 mm Number of attempts: 2 (MAC 4 GRADE 3 VIEW, CHANGED TO MCGRATH) Airway Equipment and Method: Stylet Placement Confirmation: ETT inserted through vocal cords under direct vision, positive ETCO2 and breath sounds checked- equal and bilateral Secured at: 21 cm Tube secured with: Tape Dental Injury: Teeth and Oropharynx as per pre-operative assessment  Difficulty Due To: Difficulty was anticipated

## 2022-10-13 NOTE — Anesthesia Preprocedure Evaluation (Signed)
Anesthesia Evaluation  Patient identified by MRN, date of birth, ID band Patient awake    Reviewed: Allergy & Precautions, NPO status , Patient's Chart, lab work & pertinent test results  History of Anesthesia Complications Negative for: history of anesthetic complications  Airway Mallampati: III  TM Distance: >3 FB Neck ROM: Full    Dental no notable dental hx. (+) Teeth Intact   Pulmonary sleep apnea and Continuous Positive Airway Pressure Ventilation , neg COPD, Patient abstained from smoking.Not current smoker, former smoker   Pulmonary exam normal breath sounds clear to auscultation       Cardiovascular Exercise Tolerance: Good METS(-) hypertension(-) CAD and (-) Past MI negative cardio ROS (-) dysrhythmias  Rhythm:Regular Rate:Normal - Systolic murmurs    Neuro/Psych  PSYCHIATRIC DISORDERS Anxiety     negative neurological ROS     GI/Hepatic PUD,GERD  Controlled and Medicated,,(+)     (-) substance abuse    Endo/Other  neg diabetes    Renal/GU negative Renal ROS     Musculoskeletal   Abdominal  (+) + obese  Peds  Hematology   Anesthesia Other Findings Past Medical History: No date: Allergy No date: GERD (gastroesophageal reflux disease) No date: Sleep apnea     Comment:  CPAP  Reproductive/Obstetrics                              Anesthesia Physical Anesthesia Plan  ASA: 2  Anesthesia Plan: General   Post-op Pain Management: Tylenol PO (pre-op) and Fentanyl IV   Induction: Intravenous  PONV Risk Score and Plan: 3 and Ondansetron, Dexamethasone and Midazolam  Airway Management Planned: Oral ETT and Video Laryngoscope Planned  Additional Equipment: None  Intra-op Plan:   Post-operative Plan: Extubation in OR  Informed Consent: I have reviewed the patients History and Physical, chart, labs and discussed the procedure including the risks, benefits and alternatives for  the proposed anesthesia with the patient or authorized representative who has indicated his/her understanding and acceptance.     Dental advisory given  Plan Discussed with: CRNA and Surgeon  Anesthesia Plan Comments: (Discussed risks of anesthesia with patient, including PONV, sore throat, lip/dental/eye damage. Rare risks discussed as well, such as cardiorespiratory and neurological sequelae, and allergic reactions. Discussed the role of CRNA in patient's perioperative care. Patient understands.)         Anesthesia Quick Evaluation

## 2022-10-13 NOTE — H&P (Signed)
H&P has been reviewed and patient reevaluated, no changes necessary. To be downloaded later.  

## 2022-10-13 NOTE — Op Note (Signed)
10/13/2022  11:08 AM  [id number]   Pre-Op Dx:  Deviated Nasal Septum, Hypertrophic Inferior Turbinates  Post-op Dx: Same  Proc: Nasal Septoplasty, Bilateral Partial Reduction Inferior Turbinates   Surg:  Chris Baldwin Chris Baldwin  Anes:  GOT  EBL: 50 mL  Comp: None  Findings: His ethmoid plate and quadrangular cartilage was buckled to the left side.  He had an inferior spur on the left as well with some overhanging cartilage and large vomer spur pushing into the inferior turbinate.  His right inferior turbinate was markedly overgrown.  Procedure: With the patient in a comfortable supine position,  general orotracheal anesthesia was induced without difficulty.     The patient received preoperative Afrin spray for topical decongestion and vasoconstriction.  Intravenous prophylactic antibiotics were administered.  At an appropriate level, the patient was placed in a semi-sitting position.  Nasal vibrissae were trimmed.   1% Xylocaine with 1:100,000 epinephrine, 8 cc's, was infiltrated into the anterior floor of the nose, into the nasal spine region, into the membranous columella, and finally into the submucoperichondrial plane of the septum on both sides.  Several minutes were allowed for this to take effect.  Cottoniod pledgetts soaked in Afrin and 4% Xylocaine were placed into both nasal cavities and left while the patient was prepped and draped in the standard fashion.  The materials were removed from the nose and observed to be intact and correct in number.  The nose was inspected with a headlight and zero degree scope with the findings as described above.  A left Killian incision was sharply executed and carried down to the quadrangular cartilage. The mucoperichondrium was elelvated along the quadrangular plate back to the bony-cartilaginous junction. The mucoperiostium was then elevated along the ethmoid plate and the vomer. The boney-catilaginous junction was then split with a freer elevator  and the mucoperiosteum was elevated on the opposite side. The mucoperiosteum was then elevated along the maxillary crest as needed to expose the crooked bone of the crest.  Boney spurs of the vomer and maxillary crest were removed with Donavan Foil forceps.  The cartilaginous plate was trimmed along its posterior and inferior borders of about 2 mm of cartilage to free it up inferiorly. Some of the deviated ethmoid plate was then fractured and removed with Takahashi forceps to free up the posterior border of the quadrangular plate and allow it to swing back to the midline. The mucosal flaps were placed back into their anatomic position to allow visualization of the airways. The septum now sat in the midline with an improved airway.  A 3-0 Chromic suture on a Keith needle in used to anchor the inferior septum at the nasal spine with a through and through suture. The mucosal flaps are then sutured together using a through and through whip stitch of 4-0 Plain Gut with a mini-Keith needle. This was used to close the [place] incision as well.   The inferior turbinates were then inspected. An incision was created along the inferior aspect of the left inferior turbinate with removal of some of the inferior soft tissue and bone. Electrocautery was used to control bleeding in the area. The remaining turbinate was then outfractured to open up the airway further. There was no significant bleeding noted. The right turbinate was then trimmed and outfractured in a similar fashion.  The airways were then visualized and showed open passageways on both sides that were significantly improved compared to before surgery. There was no signifcant bleeding. Nasal splints were applied to both  sides of the septum using Xomed 0.68m regular sized splints that were trimmed, and then held in position with a 3-0 Nylon through and through suture.  The patient was turned back over to anesthesia, and awakened, extubated, and taken to the PACU  in satisfactory condition.  Dispo:   PACU to home  Plan: Ice, elevation, narcotic analgesia, steroid taper, and prophylactic antibiotics for the duration of indwelling nasal foreign bodies.  We will reevaluate the patient in the office in 6 days and remove the septal splints.  Return to work in 10 days, strenuous activities in two weeks.   [name] 10/13/2022 11:08 AM

## 2022-10-13 NOTE — Transfer of Care (Signed)
Immediate Anesthesia Transfer of Care Note  Patient: Chris Baldwin  Procedure(s) Performed: NASAL SEPTOPLASTY WITH INFERIOR TURBINATE REDUCTION (Bilateral: Nose)  Patient Location: PACU  Anesthesia Type: General  Level of Consciousness: awake, alert  and patient cooperative  Airway and Oxygen Therapy: Patient Spontanous Breathing and Patient connected to supplemental oxygen  Post-op Assessment: Post-op Vital signs reviewed, Patient's Cardiovascular Status Stable, Respiratory Function Stable, Patent Airway and No signs of Nausea or vomiting  Post-op Vital Signs: Reviewed and stable  Complications:  Encounter Notable Events  Notable Event Outcome Phase Comment  Difficult to intubate - expected  Intraprocedure Filed from anesthesia note documentation.

## 2022-10-14 ENCOUNTER — Encounter: Payer: Self-pay | Admitting: Otolaryngology

## 2022-10-18 DIAGNOSIS — J3489 Other specified disorders of nose and nasal sinuses: Secondary | ICD-10-CM | POA: Diagnosis not present

## 2022-10-22 IMAGING — CT CT VIRTUAL COLONOSCOPY DIAGNOSTIC
2 of 9 series · 12 of 46 positions shown, 14 images · non-contrast
Comparison: CT abdomen/pelvis dated 09/29/2005

CLINICAL DATA: High risk colorectal cancer screening, family
history

EXAM:
CT VIRTUAL COLONOSCOPY DIAGNOSTIC
TECHNIQUE: The patient was given a standard Mag citrate bowel preparation with
Gastrografin and barium for fluid and stool tagging respectively.
The quality of the bowel preparation is moderate. Automated CO2
insufflation of the colon was performed prior to image acquisition
and colonic distention is excellent. Image post processing was used
to generate a 3D endoluminal fly-through projection of the colon and
to electronically subtract stool/fluid as appropriate.

[Series 4: supine colon 1.50 br40 s3 supine thins · axial · 0.96mm/px · z∈[+1200,+1671]mm · 9 of 394 slices shown, 11 images]
[im 40/394  soft-tissue]
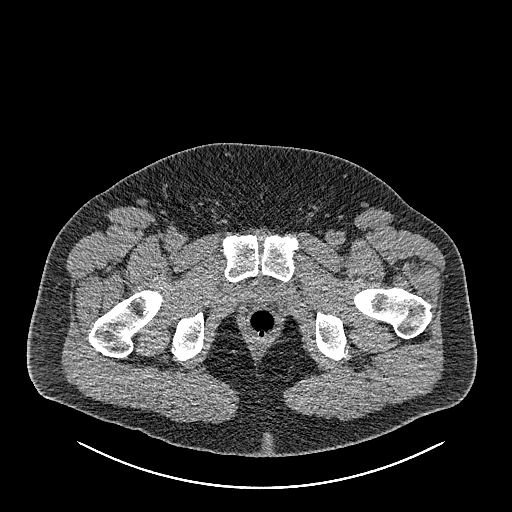
[im 40/394  bone]
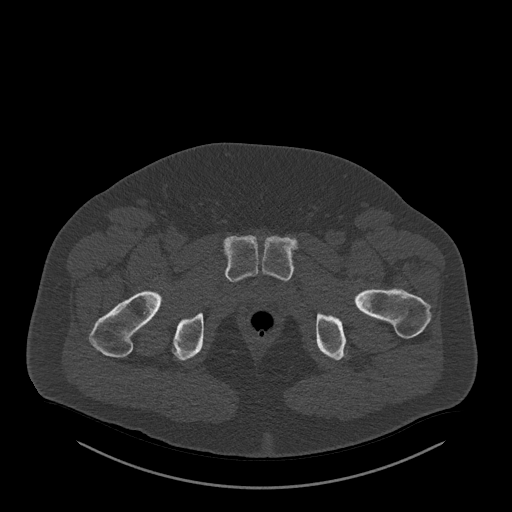
[im 79/394  soft-tissue]
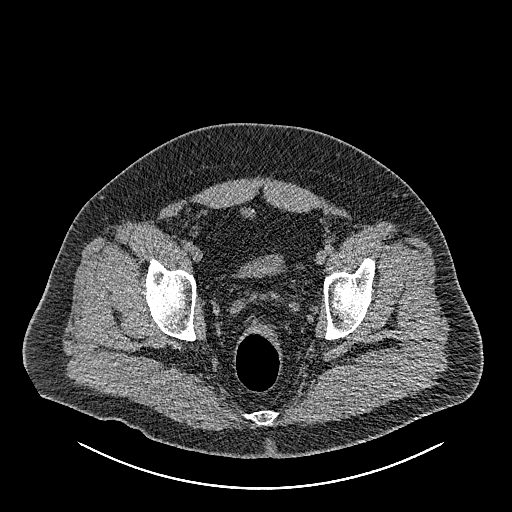
[im 118/394  soft-tissue]
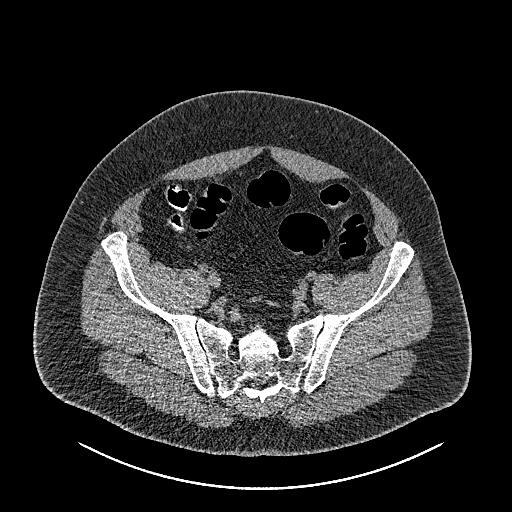
[im 158/394  soft-tissue]
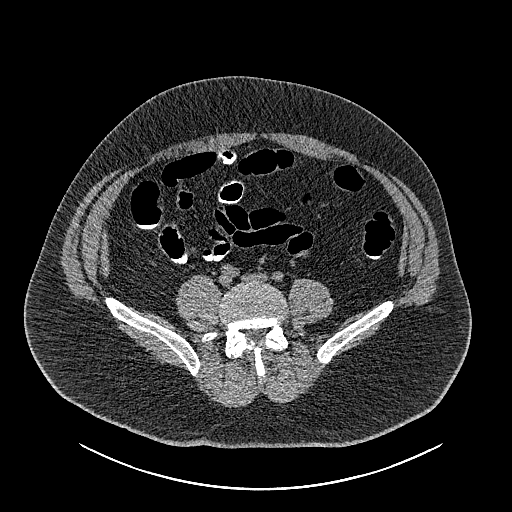
[im 197/394  soft-tissue]
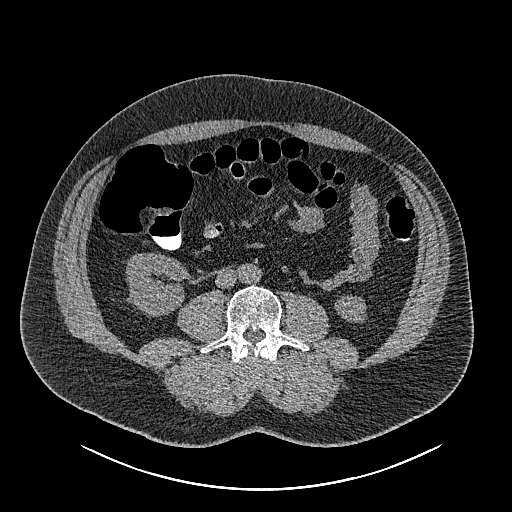
[im 236/394  soft-tissue]
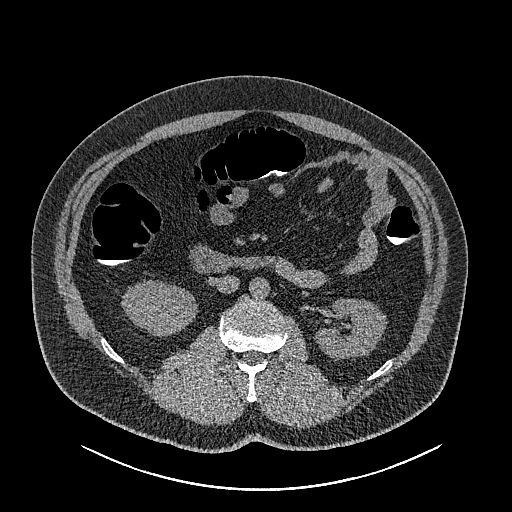
[im 276/394  soft-tissue]
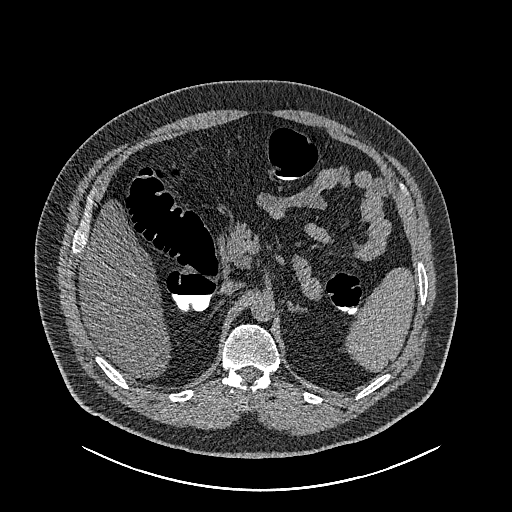
[im 315/394  soft-tissue]
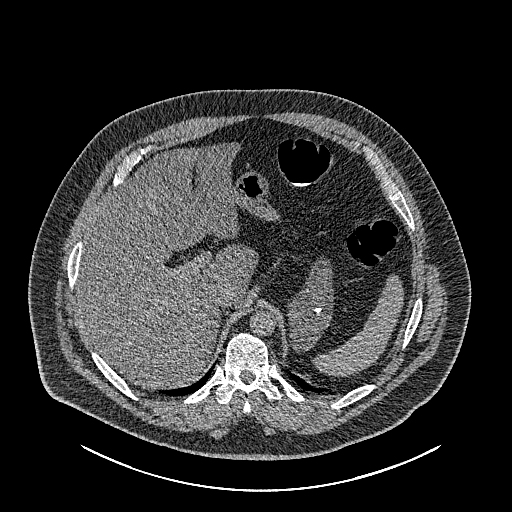
[im 354/394  soft-tissue]
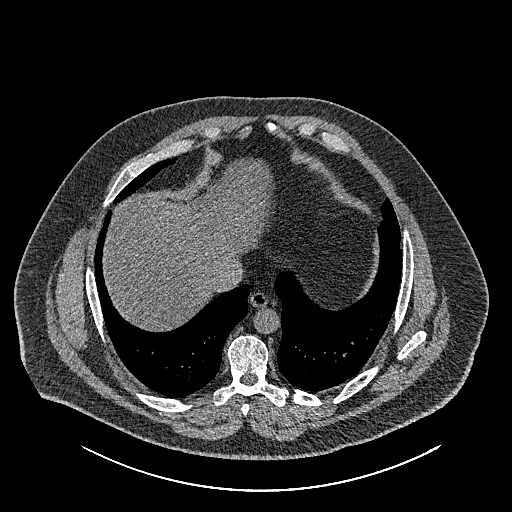
[im 354/394  bone]
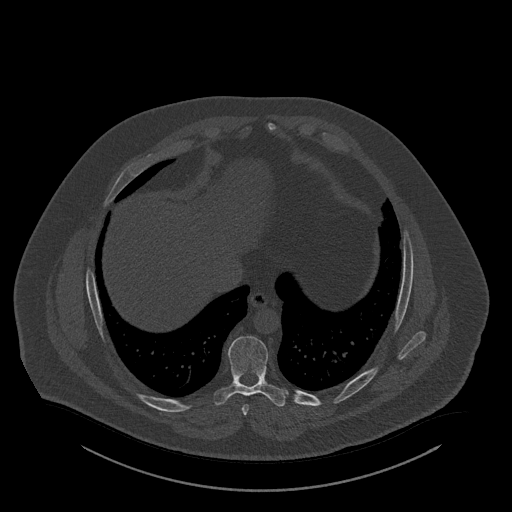

[Series 6: supine colon 3.00 br40 s3 cor supine · coronal · 0.95mm/px · 3 of 125 slices shown]
[im 32/125  soft-tissue]
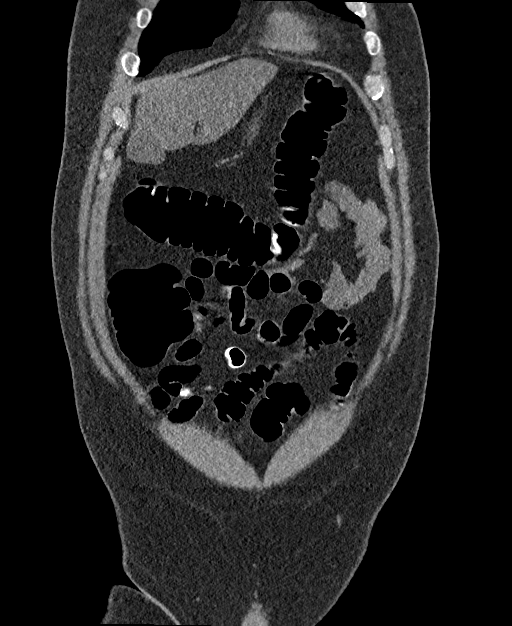
[im 63/125  soft-tissue]
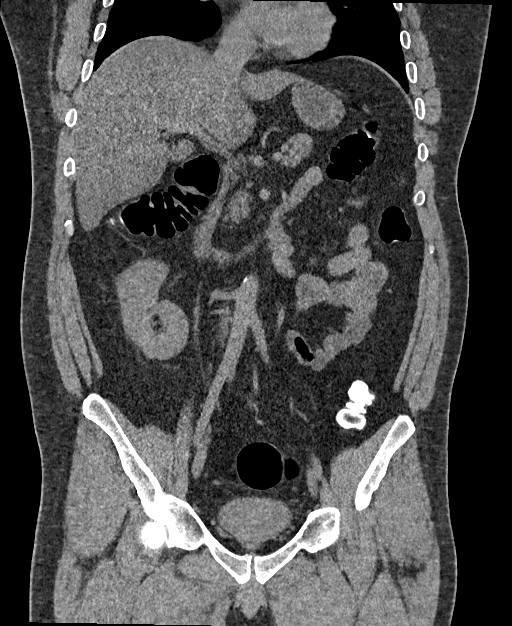
[im 94/125  soft-tissue]
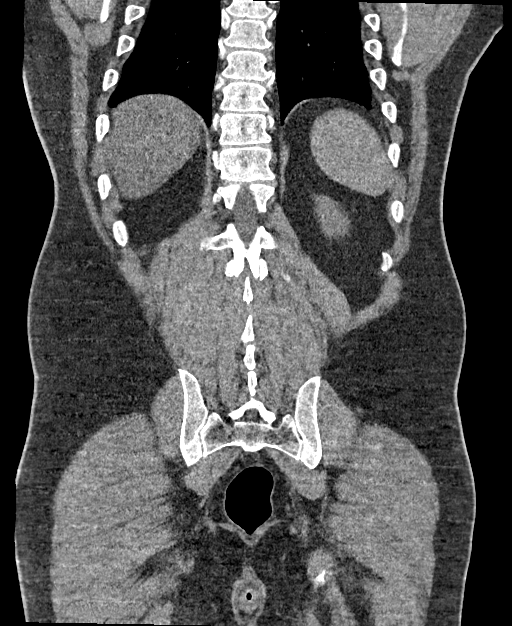

[12 of 46 positions shown; findings below may reference images not displayed]

FINDINGS: VIRTUAL COLONOSCOPY

Mild layering fluid/contrast, which shifts positions on supine/prone
imaging, but does not constraining evaluation.

Excellent colonic distension.  Tortuosity of the sigmoid colon.

No significant colonic polyp, mass, apple core lesion, or stricture.

No evidence of bowel obstruction.  Normal appendix.

Virtual colonoscopy is not designed to detect diminutive polyps
(i.e., less than or equal to 5 mm), the presence or absence of which
may not affect clinical management.

CT ABDOMEN AND PELVIS WITHOUT CONTRAST

Lower chest: Lung bases are clear.

Hepatobiliary: Mild geographic hepatic steatosis.

Gallbladder is unremarkable. No intrahepatic or extrahepatic ductal
dilatation.

Pancreas: Within normal limits.

Spleen: Normal limits.

Adrenals/Urinary Tract: Adrenal glands are within normal limits.

Kidneys are within normal limits. No renal calculi or
hydronephrosis.

Bladder is underdistended but unremarkable.

Stomach/Bowel: Stomach is within normal limits.

Visualized bowel is described above.

Vascular/Lymphatic: No evidence of abdominal aortic aneurysm. Mild
atherosclerotic calcifications the abdominal aorta.

No suspicious abdominopelvic lymphadenopathy.

Reproductive: Prostate is unremarkable.

Other: No abdominopelvic ascites.

Mild fat in the right inguinal canal.

Musculoskeletal: Mild degenerative changes of the visualized
thoracolumbar spine.
IMPRESSION: No significant colonic polyp, mass, apple core lesion, or stricture.

Prior appendectomy.

Mild hepatic steatosis.

## 2022-10-25 DIAGNOSIS — J3489 Other specified disorders of nose and nasal sinuses: Secondary | ICD-10-CM | POA: Diagnosis not present

## 2022-10-27 DIAGNOSIS — G4733 Obstructive sleep apnea (adult) (pediatric): Secondary | ICD-10-CM | POA: Diagnosis not present

## 2022-11-08 DIAGNOSIS — I781 Nevus, non-neoplastic: Secondary | ICD-10-CM | POA: Diagnosis not present

## 2022-11-08 DIAGNOSIS — L821 Other seborrheic keratosis: Secondary | ICD-10-CM | POA: Diagnosis not present

## 2022-11-08 DIAGNOSIS — D485 Neoplasm of uncertain behavior of skin: Secondary | ICD-10-CM | POA: Diagnosis not present

## 2022-11-08 DIAGNOSIS — L820 Inflamed seborrheic keratosis: Secondary | ICD-10-CM | POA: Diagnosis not present

## 2022-11-08 DIAGNOSIS — J3489 Other specified disorders of nose and nasal sinuses: Secondary | ICD-10-CM | POA: Diagnosis not present

## 2022-11-22 DIAGNOSIS — J3489 Other specified disorders of nose and nasal sinuses: Secondary | ICD-10-CM | POA: Diagnosis not present

## 2022-11-27 DIAGNOSIS — G4733 Obstructive sleep apnea (adult) (pediatric): Secondary | ICD-10-CM | POA: Diagnosis not present

## 2022-12-19 DIAGNOSIS — M25562 Pain in left knee: Secondary | ICD-10-CM | POA: Diagnosis not present

## 2022-12-28 DIAGNOSIS — G4733 Obstructive sleep apnea (adult) (pediatric): Secondary | ICD-10-CM | POA: Diagnosis not present

## 2022-12-29 DIAGNOSIS — J3489 Other specified disorders of nose and nasal sinuses: Secondary | ICD-10-CM | POA: Diagnosis not present

## 2023-01-02 DIAGNOSIS — G4733 Obstructive sleep apnea (adult) (pediatric): Secondary | ICD-10-CM | POA: Diagnosis not present

## 2023-01-22 ENCOUNTER — Emergency Department: Payer: BC Managed Care – PPO

## 2023-01-22 ENCOUNTER — Other Ambulatory Visit: Payer: Self-pay

## 2023-01-22 DIAGNOSIS — R5383 Other fatigue: Secondary | ICD-10-CM | POA: Insufficient documentation

## 2023-01-22 DIAGNOSIS — R5381 Other malaise: Secondary | ICD-10-CM | POA: Diagnosis not present

## 2023-01-22 DIAGNOSIS — R0689 Other abnormalities of breathing: Secondary | ICD-10-CM | POA: Insufficient documentation

## 2023-01-22 DIAGNOSIS — R000 Tachycardia, unspecified: Secondary | ICD-10-CM | POA: Diagnosis not present

## 2023-01-22 DIAGNOSIS — R0789 Other chest pain: Secondary | ICD-10-CM | POA: Diagnosis not present

## 2023-01-22 DIAGNOSIS — Z20822 Contact with and (suspected) exposure to covid-19: Secondary | ICD-10-CM | POA: Insufficient documentation

## 2023-01-22 DIAGNOSIS — R1013 Epigastric pain: Secondary | ICD-10-CM | POA: Diagnosis not present

## 2023-01-22 DIAGNOSIS — R932 Abnormal findings on diagnostic imaging of liver and biliary tract: Secondary | ICD-10-CM | POA: Diagnosis not present

## 2023-01-22 DIAGNOSIS — K760 Fatty (change of) liver, not elsewhere classified: Secondary | ICD-10-CM | POA: Diagnosis not present

## 2023-01-22 DIAGNOSIS — R0602 Shortness of breath: Secondary | ICD-10-CM | POA: Diagnosis not present

## 2023-01-22 DIAGNOSIS — R079 Chest pain, unspecified: Secondary | ICD-10-CM | POA: Diagnosis not present

## 2023-01-22 LAB — CBC
HCT: 47.3 % (ref 39.0–52.0)
Hemoglobin: 16.1 g/dL (ref 13.0–17.0)
MCH: 30.3 pg (ref 26.0–34.0)
MCHC: 34 g/dL (ref 30.0–36.0)
MCV: 88.9 fL (ref 80.0–100.0)
Platelets: 223 10*3/uL (ref 150–400)
RBC: 5.32 MIL/uL (ref 4.22–5.81)
RDW: 13 % (ref 11.5–15.5)
WBC: 9.9 10*3/uL (ref 4.0–10.5)
nRBC: 0 % (ref 0.0–0.2)

## 2023-01-22 LAB — BASIC METABOLIC PANEL
Anion gap: 9 (ref 5–15)
BUN: 14 mg/dL (ref 6–20)
CO2: 23 mmol/L (ref 22–32)
Calcium: 9.2 mg/dL (ref 8.9–10.3)
Chloride: 105 mmol/L (ref 98–111)
Creatinine, Ser: 1.09 mg/dL (ref 0.61–1.24)
GFR, Estimated: >60 mL/min (ref >60)
Glucose, Bld: 126 mg/dL — ABNORMAL HIGH (ref 70–99)
Potassium: 3.6 mmol/L (ref 3.5–5.1)
Sodium: 137 mmol/L (ref 135–145)

## 2023-01-22 LAB — RESP PANEL BY RT-PCR (RSV, FLU A&B, COVID)  RVPGX2
Influenza A by PCR: NEGATIVE
Influenza B by PCR: NEGATIVE
Resp Syncytial Virus by PCR: NEGATIVE
SARS Coronavirus 2 by RT PCR: NEGATIVE

## 2023-01-22 LAB — TROPONIN I (HIGH SENSITIVITY): Troponin I (High Sensitivity): 4 ng/L (ref <18)

## 2023-01-22 NOTE — ED Triage Notes (Signed)
Pt to ED from home for CP, SOB, tachycardia and increased fatigue x7 days. Pt is CAOx4 and in no acute distress and ambulatory in triage.

## 2023-01-23 ENCOUNTER — Emergency Department: Payer: BC Managed Care – PPO

## 2023-01-23 ENCOUNTER — Emergency Department
Admission: EM | Admit: 2023-01-23 | Discharge: 2023-01-23 | Disposition: A | Discharge status: Home / Self Care | Payer: BC Managed Care – PPO | Attending: Emergency Medicine | Admitting: Emergency Medicine

## 2023-01-23 ENCOUNTER — Other Ambulatory Visit: Payer: Self-pay

## 2023-01-23 DIAGNOSIS — R1013 Epigastric pain: Secondary | ICD-10-CM | POA: Diagnosis not present

## 2023-01-23 DIAGNOSIS — R0789 Other chest pain: Secondary | ICD-10-CM

## 2023-01-23 DIAGNOSIS — R932 Abnormal findings on diagnostic imaging of liver and biliary tract: Secondary | ICD-10-CM | POA: Diagnosis not present

## 2023-01-23 DIAGNOSIS — R0609 Other forms of dyspnea: Secondary | ICD-10-CM

## 2023-01-23 DIAGNOSIS — K760 Fatty (change of) liver, not elsewhere classified: Secondary | ICD-10-CM | POA: Diagnosis not present

## 2023-01-23 DIAGNOSIS — R5381 Other malaise: Secondary | ICD-10-CM

## 2023-01-23 LAB — HEPATIC FUNCTION PANEL
ALT: 33 U/L (ref 0–44)
AST: 29 U/L (ref 15–41)
Albumin: 4 g/dL (ref 3.5–5.0)
Alkaline Phosphatase: 45 U/L (ref 38–126)
Bilirubin, Direct: 0.1 mg/dL (ref 0.0–0.2)
Indirect Bilirubin: 0.5 mg/dL (ref 0.3–0.9)
Total Bilirubin: 0.6 mg/dL (ref 0.3–1.2)
Total Protein: 7.2 g/dL (ref 6.5–8.1)

## 2023-01-23 LAB — TROPONIN I (HIGH SENSITIVITY): Troponin I (High Sensitivity): <2 ng/L (ref <18)

## 2023-01-23 LAB — LIPASE, BLOOD: Lipase: 31 U/L (ref 11–51)

## 2023-01-23 MED ORDER — ALUM & MAG HYDROXIDE-SIMETH 200-200-20 MG/5ML PO SUSP
30.0000 mL | Freq: Once | ORAL | Status: AC
  Administered 2023-01-23: 30 mL via ORAL
  Filled 2023-01-23: qty 30

## 2023-01-23 NOTE — ED Provider Notes (Signed)
[facility] Provider Note    Event Date/Time   First MD Initiated Contact with Patient 01/23/23 0021     (approximate)   History   Chest Pain   HPI  NA Chris Baldwin is a 47 y.o. male with history of obesity, obstructive sleep apnea, GERD, and other prior GI issues.  Does not regularly take reflux medicine even though he has frequent acid reflux, but he recently started taking famotidine.  For about a week he has had malaise/fatigue and increased shortness of breath with exertion but also sometimes at rest.  He uses a CPAP every night for his OSA.  Tonight he was feeling short of breath with some associated chest tightness.  He has not had heart problems before of which she is aware and he does not have COPD or asthma.  He has been seen previously for shortness of breath without a specific explanation or diagnosis.  He has not had a fever of which she is aware.  He has had no nausea or vomiting but continues to have some discomfort in his upper abdomen that he associates with increased reflux recently.  He is not sure if the famotidine helps but he said that he has had Prilosec in the past but he had an allergic reaction to it so he is reluctant to take any PPIs.     Physical Exam   Triage Vital Signs: ED Triage Vitals  Enc Vitals Group     BP 01/22/23 2034 (!) 144/103     Pulse Rate 01/22/23 2034 84     Resp 01/22/23 2029 18     Temp 01/22/23 2029 98 F (36.7 C)     Temp Source 01/22/23 2029 Oral     SpO2 01/22/23 2034 98 %     Weight 01/22/23 2031 (!) 142.9 kg (315 lb)     Height 01/22/23 2031 1.956 m (6\' 5" )     Head Circumference --      Peak Flow --      Pain Score 01/22/23 2031 0     Pain Loc --      Pain Edu? --      Excl. in [place]? --     Most recent vital signs: Vitals:   01/23/23 0130 01/23/23 0200  BP: 133/89 134/88  Pulse: 73 99  Resp: (!) 21   Temp:    SpO2: 95% 97%     General: Awake, no distress.  CV:  Good peripheral  perfusion.  Normal heart sounds.  Regular rate and rhythm. Resp:  Normal effort. Speaking easily and comfortably, no accessory muscle usage nor intercostal retractions.  Lungs are clear to auscultation and equal bilaterally. Abd:  No distention.  Mild tenderness to palpation of the epigastrium and right upper quadrant with negative Murphy sign.  No lower abdominal tenderness. Other:  Patient seems little bit anxious about his medical situation but it is understandable and appropriate under the circumstances.  No warning signs.   ED Results / Procedures / Treatments   Labs (all labs ordered are listed, but only abnormal results are displayed) Labs Reviewed  BASIC METABOLIC PANEL - Abnormal; Notable for the following components:      Result Value   Glucose, Bld 126 (*)    All other components within normal limits  RESP PANEL BY RT-PCR (RSV, FLU A&B, COVID)  RVPGX2  CBC  LIPASE, BLOOD  HEPATIC FUNCTION PANEL  TROPONIN I (HIGH SENSITIVITY)  TROPONIN I (HIGH SENSITIVITY)  EKG  ED ECG REPORT I, Hinda Kehr, the attending physician, personally viewed and interpreted this ECG.  Date: 01/22/2023 EKG Time: 20: 26 Rate: 89 Rhythm: normal sinus rhythm QRS Axis: normal Intervals: normal ST/T Wave abnormalities: Non-specific ST segment / T-wave changes, but no clear evidence of acute ischemia. Narrative Interpretation: no definitive evidence of acute ischemia; does not meet STEMI criteria.  ED ECG REPORT I, Hinda Kehr, the attending physician, personally viewed and interpreted this ECG.  Date: 01/23/2023 EKG Time: 1:36 AM Rate: 73 Rhythm: normal sinus rhythm QRS Axis: normal Intervals: normal ST/T Wave abnormalities: Non-specific ST segment / T-wave changes, but no clear evidence of acute ischemia. Narrative Interpretation: no definitive evidence of acute ischemia; does not meet STEMI criteria.    RADIOLOGY I viewed and interpreted the patient's two-view chest and I see  no evidence of pneumonia, pneumothorax, pulmonary edema, nor other acute abnormality.  I also read the radiologist's report, which confirmed no acute findings.  see hospital course for details regarding specifics of ultrasound: No acute abnormalities identified after I viewed and interpreted myself    PROCEDURES:  Critical Care performed: No  .1-3 Lead EKG Interpretation  Performed by: Hinda Kehr, MD Authorized by: Hinda Kehr, MD     Interpretation: normal     ECG rate:  98   ECG rate assessment: normal     Rhythm: sinus rhythm     Ectopy: none     Conduction: normal      MEDICATIONS ORDERED IN ED: Medications  alum & mag hydroxide-simeth (MAALOX/MYLANTA) 200-200-20 MG/5ML suspension 30 mL (30 mLs Oral Given 01/23/23 0233)     IMPRESSION / MDM / ASSESSMENT AND PLAN / ED COURSE  I reviewed the triage vital signs and the nursing notes.                              Differential diagnosis includes, but is not limited to, acid reflux, pancreatitis, biliary colic, ACS, pneumonia, pneumothorax, CHF.  Patient's presentation is most consistent with acute presentation with potential threat to life or bodily function.  Labs/studies ordered: EKG x 2, two-view chest x-ray, abdominal ultrasound right upper quadrant, CBC, BMP, respiratory viral panel, lipase, hepatic function panel, high-sensitivity troponin x 2 Interventions/Medications given: Maalox 30 mL p.o. [facility] Course my include additional interventions or labs/studies not listed above.)  Normal vital signs, reassuring lab work although hepatic function panel and lipase are still pending.  2 EKGs are negative and 2 high-sensitivity troponins are negative.  Suspect viral illness with chronic acid reflux and atypical chest pain.  Ordered ultrasound of the right upper quadrant to verified no gallstones which could be causing biliary colic.  Low risk for ACS based on HEAR score and PERC negative.  The patient is on the  cardiac monitor to evaluate for evidence of arrhythmia and/or significant heart rate changes.   Clinical Course as of 01/23/23 0327  Mon Jan 23, 2023  0140 Normal hepatic function panel and lipase. [CF]  0140 US ABDOMEN LIMITED RUQ (LIVER/GB) I viewed and interpreted the patient's ultrasound.  I see no gallstones or evidence of cholecystitis [CF]  0326 (delayed documentation) evaluation has been reassuring.  Patient is in no distress and has been stable for 6 hours in the emergency department.  I offered him referral to cardiology for the dyspnea particular with exertion but he prefers to follow-up with his PCP which I think is reasonable.  I gave my usual  and customary return precautions and he understands and agrees with the plan. [CF]    Clinical Course User Index [CF] Hinda Kehr, MD     FINAL CLINICAL IMPRESSION(S) / ED DIAGNOSES   Final diagnoses:  Malaise and fatigue  Dyspnea on exertion  Atypical chest pain  Epigastric pain     Rx / DC Orders   ED Discharge Orders     None        Note:  This document was prepared using Dragon voice recognition software and may include unintentional dictation errors.   Hinda Kehr, MD 01/23/23 [phone]

## 2023-01-23 NOTE — Discharge Instructions (Signed)
Your workup in the Emergency Department today was reassuring.  We did not find any specific abnormalities.  We recommend you drink plenty of fluids, take your regular medications and/or any new ones prescribed today, and follow up with the doctor(s) listed in these documents as recommended.  Return to the Emergency Department if you develop new or worsening symptoms that concern you.  

## 2023-01-27 DIAGNOSIS — E669 Obesity, unspecified: Secondary | ICD-10-CM | POA: Diagnosis not present

## 2023-01-27 DIAGNOSIS — E538 Deficiency of other specified B group vitamins: Secondary | ICD-10-CM | POA: Diagnosis not present

## 2023-01-27 DIAGNOSIS — R0602 Shortness of breath: Secondary | ICD-10-CM | POA: Diagnosis not present

## 2023-01-27 DIAGNOSIS — K219 Gastro-esophageal reflux disease without esophagitis: Secondary | ICD-10-CM | POA: Diagnosis not present

## 2023-01-31 DIAGNOSIS — G4733 Obstructive sleep apnea (adult) (pediatric): Secondary | ICD-10-CM | POA: Diagnosis not present

## 2023-03-03 DIAGNOSIS — G4733 Obstructive sleep apnea (adult) (pediatric): Secondary | ICD-10-CM | POA: Diagnosis not present

## 2023-03-22 DIAGNOSIS — E291 Testicular hypofunction: Secondary | ICD-10-CM | POA: Diagnosis not present

## 2023-03-22 DIAGNOSIS — E669 Obesity, unspecified: Secondary | ICD-10-CM | POA: Diagnosis not present

## 2023-04-10 DIAGNOSIS — G4733 Obstructive sleep apnea (adult) (pediatric): Secondary | ICD-10-CM | POA: Diagnosis not present

## 2023-05-08 DIAGNOSIS — H60501 Unspecified acute noninfective otitis externa, right ear: Secondary | ICD-10-CM | POA: Diagnosis not present

## 2023-05-08 DIAGNOSIS — J343 Hypertrophy of nasal turbinates: Secondary | ICD-10-CM | POA: Diagnosis not present

## 2023-06-10 DIAGNOSIS — G4733 Obstructive sleep apnea (adult) (pediatric): Secondary | ICD-10-CM | POA: Diagnosis not present

## 2023-07-12 DIAGNOSIS — S022XXA Fracture of nasal bones, initial encounter for closed fracture: Secondary | ICD-10-CM | POA: Diagnosis not present

## 2023-08-25 DIAGNOSIS — K219 Gastro-esophageal reflux disease without esophagitis: Secondary | ICD-10-CM | POA: Diagnosis not present

## 2023-08-25 DIAGNOSIS — E559 Vitamin D deficiency, unspecified: Secondary | ICD-10-CM | POA: Diagnosis not present

## 2023-08-25 DIAGNOSIS — Z131 Encounter for screening for diabetes mellitus: Secondary | ICD-10-CM | POA: Diagnosis not present

## 2023-08-25 DIAGNOSIS — Z0000 Encounter for general adult medical examination without abnormal findings: Secondary | ICD-10-CM | POA: Diagnosis not present

## 2023-08-25 DIAGNOSIS — E291 Testicular hypofunction: Secondary | ICD-10-CM | POA: Diagnosis not present

## 2023-08-25 DIAGNOSIS — Z13220 Encounter for screening for lipoid disorders: Secondary | ICD-10-CM | POA: Diagnosis not present

## 2023-08-25 DIAGNOSIS — E538 Deficiency of other specified B group vitamins: Secondary | ICD-10-CM | POA: Diagnosis not present

## 2023-09-08 ENCOUNTER — Other Ambulatory Visit: Payer: Self-pay

## 2023-09-08 ENCOUNTER — Emergency Department
Admission: EM | Admit: 2023-09-08 | Discharge: 2023-09-08 | Disposition: A | Discharge status: Home / Self Care | Payer: BC Managed Care – PPO | Attending: Emergency Medicine | Admitting: Emergency Medicine

## 2023-09-08 ENCOUNTER — Emergency Department: Payer: BC Managed Care – PPO

## 2023-09-08 ENCOUNTER — Encounter: Payer: Self-pay | Admitting: Emergency Medicine

## 2023-09-08 DIAGNOSIS — R079 Chest pain, unspecified: Secondary | ICD-10-CM | POA: Diagnosis not present

## 2023-09-08 DIAGNOSIS — R0789 Other chest pain: Secondary | ICD-10-CM | POA: Diagnosis not present

## 2023-09-08 DIAGNOSIS — R202 Paresthesia of skin: Secondary | ICD-10-CM | POA: Insufficient documentation

## 2023-09-08 LAB — BASIC METABOLIC PANEL
Anion gap: 8 (ref 5–15)
BUN: 16 mg/dL (ref 6–20)
CO2: 23 mmol/L (ref 22–32)
Calcium: 9.2 mg/dL (ref 8.9–10.3)
Chloride: 109 mmol/L (ref 98–111)
Creatinine, Ser: 0.95 mg/dL (ref 0.61–1.24)
GFR, Estimated: >60 mL/min (ref >60)
Glucose, Bld: 108 mg/dL — ABNORMAL HIGH (ref 70–99)
Potassium: 4.1 mmol/L (ref 3.5–5.1)
Sodium: 140 mmol/L (ref 135–145)

## 2023-09-08 LAB — CBC
HCT: 47.4 % (ref 39.0–52.0)
Hemoglobin: 16.3 g/dL (ref 13.0–17.0)
MCH: 31.3 pg (ref 26.0–34.0)
MCHC: 34.4 g/dL (ref 30.0–36.0)
MCV: 91.2 fL (ref 80.0–100.0)
Platelets: 187 10*3/uL (ref 150–400)
RBC: 5.2 MIL/uL (ref 4.22–5.81)
RDW: 12.9 % (ref 11.5–15.5)
WBC: 6.3 10*3/uL (ref 4.0–10.5)
nRBC: 0 % (ref 0.0–0.2)

## 2023-09-08 LAB — TROPONIN I (HIGH SENSITIVITY): Troponin I (High Sensitivity): 3 ng/L (ref <18)

## 2023-09-08 NOTE — ED Provider Notes (Signed)
[facility] Provider Note    Event Date/Time   First MD Initiated Contact with Patient 09/08/23 1101     (approximate)   History   Chief Complaint Chest Pain   HPI  Chris Baldwin is a 47 y.o. male with past medical history of GERD and anxiety who presents to the ED complaining of chest pain.  Patient reports that since yesterday afternoon he has been dealing with a discomfort over the left side of his chest as well as underneath his left shoulder blade that radiates down his left arm.  He reports intermittent numbness and tingling in the left arm but has not had any weakness in the arm.  He denies any fevers, cough, or difficulty breathing.  He does report frequently heavy lifting for his job, also states he was swinging a softball bat more than usual recently.     Physical Exam   Triage Vital Signs: ED Triage Vitals  Encounter Vitals Group     BP 09/08/23 0927 137/88     Systolic BP Percentile --      Diastolic BP Percentile --      Pulse Rate 09/08/23 0927 65     Resp 09/08/23 0927 18     Temp 09/08/23 0927 97.9 F (36.6 C)     Temp Source 09/08/23 0927 Oral     SpO2 09/08/23 0927 96 %     Weight 09/08/23 0925 (!) 310 lb (140.6 kg)     Height 09/08/23 0925 6\' 5"  (1.956 m)     Head Circumference --      Peak Flow --      Pain Score 09/08/23 0925 2     Pain Loc --      Pain Education --      Exclude from Growth Chart --     Most recent vital signs: Vitals:   09/08/23 1200 09/08/23 1204  BP: 116/83   Pulse: (!) 53   Resp: 12   Temp:  98.2 F (36.8 C)  SpO2: 95%     Constitutional: Alert and oriented. Eyes: Conjunctivae are normal. Head: Atraumatic. Nose: No congestion/rhinnorhea. Mouth/Throat: Mucous membranes are moist.  Cardiovascular: Normal rate, regular rhythm. Grossly normal heart sounds.  2+ radial pulses bilaterally. Respiratory: Normal respiratory effort.  No retractions. Lungs CTAB.  No chest wall tenderness to  palpation. Gastrointestinal: Soft and nontender. No distention. Musculoskeletal: No lower extremity tenderness nor edema.  Neurologic:  Normal speech and language. No gross focal neurologic deficits are appreciated.    ED Results / Procedures / Treatments   Labs (all labs ordered are listed, but only abnormal results are displayed) Labs Reviewed  BASIC METABOLIC PANEL - Abnormal; Notable for the following components:      Result Value   Glucose, Bld 108 (*)    All other components within normal limits  CBC  TROPONIN I (HIGH SENSITIVITY)     EKG  ED ECG REPORT I, Chesley Noon, the attending physician, personally viewed and interpreted this ECG.   Date: 09/08/2023  EKG Time: 9:26  Rate: 66  Rhythm: normal sinus rhythm  Axis: Normal  Intervals:none  ST&T Change: None  RADIOLOGY Chest Xray reviewed and interpreted by me with no infiltrate, edema, or effusion.  PROCEDURES:  Critical Care performed: No  Procedures   MEDICATIONS ORDERED IN ED: Medications - No data to display   IMPRESSION / MDM / ASSESSMENT AND PLAN / ED COURSE  I reviewed the triage vital signs  and the nursing notes.                              47 y.o. male with past medical history of GAD and GERD who presents to the ED complaining of discomfort in his left chest and underneath his left shoulder blade since last night radiating down his left arm with numbness and tingling.  Patient's presentation is most consistent with acute presentation with potential threat to life or bodily function.  Differential diagnosis includes, but is not limited to, ACS, PE, pneumonia, pneumothorax, musculoskeletal pain, GERD, anxiety, cervical radiculopathy, paresthesia.  Patient well-appearing and in no acute distress, vital signs are unremarkable.  EKG shows no evidence of arrhythmia or ischemia and troponin within normal limits, doubt ACS given atypical symptoms.  I also doubt PE as patient is PERC negative,  chest x-ray is unremarkable.  Remainder of labs are reassuring with no significant anemia, leukocytosis, tract abnormality, or AKI.  Symptoms seem most consistent with a cervical radiculopathy versus other musculoskeletal etiology.  Patient appropriate for outpatient management, will provide referral to cardiology and he was counseled to return to the ED for new or worsening symptoms.  Patient agrees with plan.      FINAL CLINICAL IMPRESSION(S) / ED DIAGNOSES   Final diagnoses:  Atypical chest pain  Paresthesia of left arm     Rx / DC Orders   ED Discharge Orders          Ordered    Ambulatory referral to Cardiology        09/08/23 1137             Note:  This document was prepared using Dragon voice recognition software and may include unintentional dictation errors.   Chesley Noon, MD 09/08/23 1246

## 2023-09-08 NOTE — ED Triage Notes (Signed)
Chest pain, fatigue and weakness x 6 days. Chest pain described as a discomfort yesterday and numbness down left arm. Pt also states 10lb weight loss in 4 days.

## 2023-09-08 NOTE — ED Triage Notes (Signed)
First nurse note: Pt here from [facility] clinic with CP and L arm numbness. Pt states started few days ago.

## 2023-09-08 NOTE — ED Notes (Signed)
Pt d/c home per EDP order. Discharge summary reviewed. Pt verbalizes understanding. Ambulatory off unit NAD noted.

## 2023-09-12 ENCOUNTER — Other Ambulatory Visit
Admission: RE | Admit: 2023-09-12 | Discharge: 2023-09-12 | Disposition: A | Discharge status: Home / Self Care | Payer: BC Managed Care – PPO | Source: Ambulatory Visit | Attending: Physician Assistant | Admitting: Physician Assistant

## 2023-09-12 DIAGNOSIS — M5412 Radiculopathy, cervical region: Secondary | ICD-10-CM | POA: Insufficient documentation

## 2023-09-12 DIAGNOSIS — R0602 Shortness of breath: Secondary | ICD-10-CM | POA: Insufficient documentation

## 2023-09-12 DIAGNOSIS — R300 Dysuria: Secondary | ICD-10-CM | POA: Diagnosis not present

## 2023-09-12 DIAGNOSIS — K219 Gastro-esophageal reflux disease without esophagitis: Secondary | ICD-10-CM | POA: Diagnosis not present

## 2023-09-12 LAB — D-DIMER, QUANTITATIVE: D-Dimer, Quant: <0.27 ug{FEU}/mL (ref 0.00–0.50)

## 2023-09-21 DIAGNOSIS — E291 Testicular hypofunction: Secondary | ICD-10-CM | POA: Diagnosis not present

## 2023-09-26 DIAGNOSIS — R7989 Other specified abnormal findings of blood chemistry: Secondary | ICD-10-CM | POA: Diagnosis not present

## 2023-09-26 DIAGNOSIS — Z0000 Encounter for general adult medical examination without abnormal findings: Secondary | ICD-10-CM | POA: Diagnosis not present

## 2023-09-26 DIAGNOSIS — E669 Obesity, unspecified: Secondary | ICD-10-CM | POA: Diagnosis not present

## 2023-09-26 DIAGNOSIS — G4733 Obstructive sleep apnea (adult) (pediatric): Secondary | ICD-10-CM | POA: Diagnosis not present

## 2023-09-26 DIAGNOSIS — K219 Gastro-esophageal reflux disease without esophagitis: Secondary | ICD-10-CM | POA: Diagnosis not present

## 2023-10-10 DIAGNOSIS — J3489 Other specified disorders of nose and nasal sinuses: Secondary | ICD-10-CM | POA: Diagnosis not present

## 2023-10-10 DIAGNOSIS — J328 Other chronic sinusitis: Secondary | ICD-10-CM | POA: Diagnosis not present

## 2023-10-10 DIAGNOSIS — J343 Hypertrophy of nasal turbinates: Secondary | ICD-10-CM | POA: Diagnosis not present

## 2023-10-12 ENCOUNTER — Telehealth: Payer: Self-pay

## 2023-10-12 NOTE — Telephone Encounter (Signed)
The patient is scheduled to see Nicolasa Ducking, NP on 10-16-2023 as a new patient. Called and left a voice message to inquire about any previous cardiac history. Requested the patient to call back at their earliest convenience.

## 2023-10-16 ENCOUNTER — Encounter: Payer: Self-pay | Admitting: Nurse Practitioner

## 2023-10-16 ENCOUNTER — Ambulatory Visit: Payer: BC Managed Care – PPO | Attending: Nurse Practitioner | Admitting: Nurse Practitioner

## 2023-10-16 VITALS — BP 128/80 | HR 80 | Ht 77.0 in | Wt 322.0 lb

## 2023-10-16 DIAGNOSIS — R079 Chest pain, unspecified: Secondary | ICD-10-CM

## 2023-10-16 DIAGNOSIS — R002 Palpitations: Secondary | ICD-10-CM | POA: Diagnosis not present

## 2023-10-16 DIAGNOSIS — G4733 Obstructive sleep apnea (adult) (pediatric): Secondary | ICD-10-CM

## 2023-10-16 DIAGNOSIS — R0789 Other chest pain: Secondary | ICD-10-CM | POA: Diagnosis not present

## 2023-10-16 DIAGNOSIS — I700 Atherosclerosis of aorta: Secondary | ICD-10-CM | POA: Diagnosis not present

## 2023-10-16 NOTE — Patient Instructions (Signed)
Medication Instructions:  - No changes *If you need a refill on your cardiac medications before your next appointment, please call your pharmacy*  Lab Work: - None ordered  Testing/Procedures: Your physician has requested that you have an echocardiogram. Echocardiography is a painless test that uses sound waves to create images of your heart. It provides your doctor with information about the size and shape of your heart and how well your heart's chambers and valves are working. This procedure takes approximately one hour. There are no restrictions for this procedure. Please do NOT wear cologne, perfume, aftershave, or lotions (deodorant is allowed). Please arrive 15 minutes prior to your appointment time.  Please note: We ask at that you not bring children with you during ultrasound (echo/ vascular) testing. Due to room size and safety concerns, children are not allowed in the ultrasound rooms during exams. Our front office staff cannot provide observation of children in our lobby area while testing is being conducted. An adult accompanying a patient to their appointment will only be allowed in the ultrasound room at the discretion of the ultrasound technician under special circumstances. We apologize for any inconvenience.   Follow-Up: At [facility], you and your health needs are our priority.  As part of our continuing mission to provide you with exceptional heart care, we have created designated Provider Care Teams.  These Care Teams include your primary Cardiologist (physician) and Advanced Practice Providers (APPs -  Physician Assistants and Nurse Practitioners) who all work together to provide you with the care you need, when you need it.  Your next appointment:   Follow-up as needed  Provider:   Nicolasa Ducking, NP

## 2023-10-16 NOTE — Progress Notes (Signed)
Cardiology Clinic Note   Patient Name: Chris Baldwin Date of Encounter: 10/16/2023  Primary Care Provider:  Gayla Doss, MD Primary Cardiologist:  None - New  Patient Profile    47 y.o. male with a history of GERD, fatty liver disease, obstructive sleep apnea, morbid obesity, vitamin D deficiency, insomnia, anxiety, hypotestosteronism, aortic atherosclerosis, and B12 deficiency, who presents to establish care related to chest pain.  Past Medical History    Past Medical History:  Diagnosis Date   Allergy    Anxiety    Aortic atherosclerosis (HCC)    a. 03/2021 CT Abd/Pelvis - mild abd Ao Ca2+.   B12 deficiency    Chest pain    Fatty liver disease, nonalcoholic    GERD (gastroesophageal reflux disease)    Insomnia    Low testosterone in male    Morbid obesity (HCC)    Sleep apnea    CPAP   Vitamin D deficiency    Past Surgical History:  Procedure Laterality Date   APPENDECTOMY     in young adult years   COLONOSCOPY WITH PROPOFOL N/A 04/27/2016   Procedure: COLONOSCOPY WITH PROPOFOL;  Surgeon: Scot Jun, MD;  Location: [facility] ENDOSCOPY;  Service: Endoscopy;  Laterality: N/A;   COLONOSCOPY WITH PROPOFOL N/A 12/25/2020   Procedure: COLONOSCOPY WITH PROPOFOL;  Surgeon: Pasty Spillers, MD;  Location: [facility] ENDOSCOPY;  Service: Endoscopy;  Laterality: N/A;   ESOPHAGOGASTRODUODENOSCOPY (EGD) WITH PROPOFOL N/A 12/25/2020   Procedure: ESOPHAGOGASTRODUODENOSCOPY (EGD) WITH PROPOFOL;  Surgeon: Pasty Spillers, MD;  Location: [facility] ENDOSCOPY;  Service: Endoscopy;  Laterality: N/A;   NASAL SEPTOPLASTY W/ TURBINOPLASTY Bilateral 10/13/2022   Procedure: NASAL SEPTOPLASTY WITH INFERIOR TURBINATE REDUCTION;  Surgeon: Vernie Murders, MD;  [facility];  Service: ENT;  Laterality: Bilateral;   SURGERY SCROTAL / TESTICULAR     young adult years   TONSILLECTOMY Bilateral    in childhood    Allergies  Allergies  Allergen Reactions   Nexium  [Esomeprazole Magnesium] Other (See Comments)    Brand name Brand name Brand name Brand name Brand name   Sulfa Antibiotics Itching    History of Present Illness      47 y.o. y/o male with a history of GERD, fatty liver disease, obstructive sleep apnea, morbid obesity, vitamin D deficiency, insomnia, anxiety, hypotestosteronism, aortic atherosclerosis, and B12 deficiency.  Patient has no prior cardiac history, though both of his parents had heart disease, with his father having an MI in his 24s, multiple stents prior to dying of cancer.  Patient lives locally with his wife and 23 year old daughter.  Also has a stepson in [employer].  He does not routinely exercise but is reasonably active in and around his home and at work.  He notes that ever since his first COVID-vaccine in 2022, he has had intermittent pain in his left antecubital fossa, that seems to flareup whenever he gets any type of viral infection.  In March 2024, he was seen in the emergency department with complaints of malaise, fatigue, increased dyspnea on exertion, and chest tightness.  ECG was unremarkable and troponins were normal.  Right upper quadrant ultrasound was negative for cholecystitis/cholelithiasis.  Pain was felt to be noncardiac, and he was discharged home.  In late Oct 2024, he developed chest and nasal/sinus congestion that became associated with mild, focal, left chest and left subscapular discomfort, that he describes as feeling like a knot.  In the setting of upper resp congestion,  he did have a cough, but was not otherwise experiencing dyspnea.  Chest discomfort persisted for several days, and became associated with left arm sharp pain and numbness, similar to what he had experienced before.  He was seen in urgent care and then referred to the emergency department on November 1 where ECG showed sinus rhythm with poor R wave progression.  Troponin was normal and chest x-ray was unremarkable.  Symptoms were felt to be  most consistent with cervical radiculopathy versus other musculoskeletal etiology, and he was discharged home with recommendation for cardiology follow-up.  He has since been treated with a steroid taper and antibiotics.  He remains on prednisone at this time and symptoms resolved several weeks ago without any recurrence.  He uses CPAP to sleep at night and notes that sometimes when he is very tired during the day, if he lies down in his recliner, he may note brief fluttering sensation or palpitation, though no sustained palpitations or persistent arrhythmias.  This symptom occurs about once a month.  He denies PND, orthopnea, dizziness, syncope, edema, or early satiety.  Objective  Home Medications    Current Outpatient Medications  Medication Sig Dispense Refill   cyanocobalamin (VITAMIN B12) 1000 MCG tablet Take 1,000 mcg by mouth daily.     famotidine (PEPCID) 40 MG tablet Take 40 mg by mouth as needed.     fluticasone (FLONASE) 50 MCG/ACT nasal spray Place 2 sprays into both nostrils daily. 16 g 6   Multiple Vitamins-Minerals (MULTIVITAMIN MEN) TABS Take by mouth daily. Men's one a day     predniSONE (DELTASONE) 10 MG tablet Start with 3 pills tomorrow. Taper over the next 6 days.  3,3,2,2,1,1. 12 tablet 0   testosterone enanthate (DELATESTRYL) 200 MG/ML injection Inject into the muscle every 14 (fourteen) days. For IM use only     VITAMIN D, CHOLECALCIFEROL, PO Take by mouth.     cephALEXin (KEFLEX) 500 MG capsule Take 1 capsule (500 mg total) by mouth 2 (two) times daily. (Patient not taking: Reported on 10/16/2023) 14 capsule 0   No current facility-administered medications for this visit.     Family History    Family History  Problem Relation Age of Onset   Heart disease Mother    Diabetes Mother    Cancer Mother        colon   Arthritis Mother    Heart disease Father        heart attack - first in 44's; multiple stents   Arthritis Father    Alcohol abuse Maternal Uncle    He  indicated that his mother is deceased. He indicated that his father is deceased. He indicated that both of his sisters are alive. He indicated that his daughter is alive. He indicated that his son is alive. He indicated that the status of his maternal uncle is unknown.   Social History    Social History   Socioeconomic History   Marital status: Married    Spouse name: angela    Number of children: 1   Years of education: Not on file   Highest education level: Associate degree: occupational, Scientist, product/process development, or vocational program  Occupational History   Not on file  Tobacco Use   Smoking status: Former    Current packs/day: 0.00    Types: Cigarettes    Start date: 02/20/2012    Quit date: 02/19/2013    Years since quitting: 10.6    Passive exposure: Current   Smokeless tobacco: Never  Vaping Use   Vaping status: Never Used  Substance and Sexual Activity   Alcohol use: Yes    Alcohol/week: 2.0 standard drinks of alcohol    Types: 2 Standard drinks or equivalent per week    Comment: occasional   Drug use: Not Currently   Sexual activity: Yes    Partners: Female  Other Topics Concern   Not on file  Social History Narrative   Lives in [place], married, 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   Social Determinants of Health   Financial Resource Strain: Low Risk  (09/26/2023)   Received from [facility]   Overall Financial Resource Strain (CARDIA)    Difficulty of Paying Living Expenses: Not hard at all  Food Insecurity: No Food Insecurity (09/26/2023)   Received from [facility]   Hunger Vital Sign    Worried About Running Out of Food in the Last Year: Never true    Ran Out of Food in the Last Year: Never true  Transportation Needs: No Transportation Needs (09/26/2023)   Received from [facility] - Transportation    In the past 12 months, has  lack of transportation kept you from medical appointments or from getting medications?: No    Lack of Transportation (Non-Medical): No  Physical Activity: Sufficiently Active (12/24/2018)   Exercise Vital Sign    Days of Exercise per Week: 7 days    Minutes of Exercise per Session: 150+ min  Stress: Stress Concern Present (12/24/2018)   [employer] of Occupational Health - Occupational Stress Questionnaire    Feeling of Stress : Rather much  Social Connections: Socially Integrated (12/24/2018)   Social Connection and Isolation Panel [NHANES]    Frequency of Communication with Friends and Family: More than three times a week    Frequency of Social Gatherings with Friends and Family: More than three times a week    Attends Religious Services: More than 4 times per year    Active Member of [employer] or Organizations: Yes    Attends Engineer, structural: More than 4 times per year    Marital Status: Married  Catering manager Violence: Not At Risk (12/24/2018)   Humiliation, Afraid, Rape, and Kick questionnaire    Fear of Current or Ex-Partner: No    Emotionally Abused: No    Physically Abused: No    Sexually Abused: No     Review of Systems    General:  No chills, fever, night sweats or weight changes.  Cardiovascular:  +++ Focal left-sided subscapular and chest pain, no dyspnea on exertion, edema, orthopnea, +++ occasional brief palpitations, no paroxysmal nocturnal dyspnea. Dermatological: No rash, lesions/masses Respiratory: +++ cough in late October/early November since resolved, no dyspnea Urologic: No hematuria, dysuria Abdominal:   No nausea, vomiting, diarrhea, bright red blood per rectum, melena, or hematemesis Neurologic:  No visual changes, wkns, changes in mental status. All other systems reviewed and are otherwise negative except as noted above.     Physical Exam    VS:  BP 128/80 (BP Location: Left Arm, Patient Position: Sitting, Cuff Size: Normal)   Pulse 80    Ht 6\' 5"  (1.956 m)   Wt (!) 322 lb (146.1 kg)   SpO2 96%   BMI 38.18 kg/m  , BMI Body mass index is 38.18 kg/m.     GEN: Well nourished, well developed, in no  acute distress. HEENT: normal. Neck: Supple, no JVD, carotid bruits, or masses. Cardiac: RRR, no murmurs, rubs, or gallops. No clubbing, cyanosis, edema.  Radials 2+/PT 2+ and equal bilaterally.  Respiratory:  Respirations regular and unlabored, clear to auscultation bilaterally. GI: Soft, nontender, nondistended, BS + x 4. MS: no deformity or atrophy. Skin: warm and dry, no rash. Neuro:  Strength and sensation are intact. Psych: Normal affect.  Accessory Clinical Findings    ECG personally reviewed by me today- EKG Interpretation Date/Time:  Monday October 16 2023 08:36:41 EST Ventricular Rate:  80 PR Interval:  200 QRS Duration:  110 QT Interval:  370 QTC Calculation: 426 R Axis:   21  Text Interpretation: Normal sinus rhythm Possible Inferior infarct , age undetermined Confirmed by Nicolasa Ducking ([id number]) on 10/16/2023 8:41:16 AM  - No acute changes  Lab Results  Component Value Date   WBC 6.3 09/08/2023   HGB 16.3 09/08/2023   HCT 47.4 09/08/2023   MCV 91.2 09/08/2023   PLT 187 09/08/2023   Lab Results  Component Value Date   CREATININE 0.95 09/08/2023   BUN 16 09/08/2023   NA 140 09/08/2023   K 4.1 09/08/2023   CL 109 09/08/2023   CO2 23 09/08/2023   Labs from care everywhere dated August 25, 2023  Calcium 9.2, albumin 4.5, total protein 6.8 total bilirubin 0.6, alkaline phosphatase 48, AST 25, ALT 36 Hemoglobin A1c 5.6 Total cholesterol 196, triglycerides 227, HDL 33.8, LDL 117     Assessment & Plan   1.  Left chest pain: Patient evaluated in the emergency department in November due to focal, constant left chest and subscapular pain with associated left arm pain and numbness times several days, that started after experiencing chest and nasal congestion with cough.  Troponins were normal.   ECG with possible prior anterior/inferior infarcts.  He was treated for cervical radiculopathy and upper respiratory infection with antibiotics and steroids, with resolution of symptoms.  He is still on a steroid taper and has had no recurrence of symptoms.  He does not routinely exercise but is routinely active without symptoms or limitations.  I will arrange for 2D echocardiogram in light of symptoms and ECG abnormality.  Defer ischemic evaluation pending echo.  2.  Palpitations: Occasional brief palpitations that occur when he is very tired and dozing off to sleep without his CPAP when napping in the afternoon.  This does not happen often, maybe once or at most twice a month.  It has been several weeks since he had any episodes.  We discussed the potential role of monitoring and mutually agreed to defer at this time given paucity of symptoms.  He is considering buying a wearable such as an Scientist, physiological.  We also discussed the utility of a Kardia mobile.  We agreed to reconsider outpatient monitoring for more progressive or persistent symptoms.  3.  Obstructive sleep apnea: Uses CPAP at night but not always when he is napping.  Discussed that he should plan to use this if he is taking a nap during the day.  4.  Morbid obesity: BMI of 38.18.  Provided that echo looks okay and no recurrence of symptoms, would greatly benefit from regular exercise and caloric restriction.  5.  Aortic atherosclerosis/hyperlipidemia: Mild abdominal aortic atherosclerosis/calcium noted on May 2022 CT of the abdomen and pelvis.  His LDL was 117 in October.  In the absence of significant lifestyle changes with reduction of LDL, would have a low threshold to add statin.  6.  Disposition: Follow-up echo.  We will determine follow-up pending echo though patient prefers to follow-up as needed if echo looks okay.  Nicolasa Ducking, NP 10/16/2023, 1:09 PM

## 2023-10-18 DIAGNOSIS — J343 Hypertrophy of nasal turbinates: Secondary | ICD-10-CM | POA: Diagnosis not present

## 2023-11-04 DIAGNOSIS — M5412 Radiculopathy, cervical region: Secondary | ICD-10-CM | POA: Diagnosis not present

## 2023-11-09 ENCOUNTER — Other Ambulatory Visit: Payer: Self-pay

## 2023-11-09 ENCOUNTER — Emergency Department: Payer: BC Managed Care – PPO

## 2023-11-09 ENCOUNTER — Encounter: Payer: Self-pay | Admitting: Radiology

## 2023-11-09 ENCOUNTER — Emergency Department
Admission: EM | Admit: 2023-11-09 | Discharge: 2023-11-10 | Disposition: A | Discharge status: Home / Self Care | Payer: BC Managed Care – PPO | Attending: Emergency Medicine | Admitting: Emergency Medicine

## 2023-11-09 ENCOUNTER — Ambulatory Visit
Admission: EM | Admit: 2023-11-09 | Discharge: 2023-11-09 | Disposition: A | Discharge status: Home / Self Care | Payer: BC Managed Care – PPO

## 2023-11-09 DIAGNOSIS — R1084 Generalized abdominal pain: Secondary | ICD-10-CM | POA: Insufficient documentation

## 2023-11-09 DIAGNOSIS — R110 Nausea: Secondary | ICD-10-CM | POA: Diagnosis not present

## 2023-11-09 DIAGNOSIS — H6693 Otitis media, unspecified, bilateral: Secondary | ICD-10-CM

## 2023-11-09 DIAGNOSIS — R935 Abnormal findings on diagnostic imaging of other abdominal regions, including retroperitoneum: Secondary | ICD-10-CM | POA: Diagnosis not present

## 2023-11-09 DIAGNOSIS — B349 Viral infection, unspecified: Secondary | ICD-10-CM

## 2023-11-09 DIAGNOSIS — R42 Dizziness and giddiness: Secondary | ICD-10-CM | POA: Insufficient documentation

## 2023-11-09 DIAGNOSIS — R1032 Left lower quadrant pain: Secondary | ICD-10-CM | POA: Diagnosis not present

## 2023-11-09 DIAGNOSIS — R519 Headache, unspecified: Secondary | ICD-10-CM | POA: Insufficient documentation

## 2023-11-09 DIAGNOSIS — R1010 Upper abdominal pain, unspecified: Secondary | ICD-10-CM | POA: Diagnosis not present

## 2023-11-09 DIAGNOSIS — M545 Low back pain, unspecified: Secondary | ICD-10-CM | POA: Insufficient documentation

## 2023-11-09 DIAGNOSIS — Z20822 Contact with and (suspected) exposure to covid-19: Secondary | ICD-10-CM | POA: Diagnosis not present

## 2023-11-09 DIAGNOSIS — M546 Pain in thoracic spine: Secondary | ICD-10-CM | POA: Diagnosis not present

## 2023-11-09 DIAGNOSIS — R140 Abdominal distension (gaseous): Secondary | ICD-10-CM | POA: Diagnosis not present

## 2023-11-09 DIAGNOSIS — R29818 Other symptoms and signs involving the nervous system: Secondary | ICD-10-CM | POA: Diagnosis not present

## 2023-11-09 DIAGNOSIS — R079 Chest pain, unspecified: Secondary | ICD-10-CM | POA: Diagnosis not present

## 2023-11-09 DIAGNOSIS — K4090 Unilateral inguinal hernia, without obstruction or gangrene, not specified as recurrent: Secondary | ICD-10-CM | POA: Diagnosis not present

## 2023-11-09 LAB — COMPREHENSIVE METABOLIC PANEL
ALT: 31 U/L (ref 0–44)
AST: 24 U/L (ref 15–41)
Albumin: 4.4 g/dL (ref 3.5–5.0)
Alkaline Phosphatase: 42 U/L (ref 38–126)
Anion gap: 12 (ref 5–15)
BUN: 16 mg/dL (ref 6–20)
CO2: 22 mmol/L (ref 22–32)
Calcium: 8.9 mg/dL (ref 8.9–10.3)
Chloride: 101 mmol/L (ref 98–111)
Creatinine, Ser: 1.05 mg/dL (ref 0.61–1.24)
GFR, Estimated: >60 mL/min (ref >60)
Glucose, Bld: 105 mg/dL — ABNORMAL HIGH (ref 70–99)
Potassium: 4.1 mmol/L (ref 3.5–5.1)
Sodium: 135 mmol/L (ref 135–145)
Total Bilirubin: 0.8 mg/dL (ref 0.0–1.2)
Total Protein: 7.4 g/dL (ref 6.5–8.1)

## 2023-11-09 LAB — URINALYSIS, ROUTINE W REFLEX MICROSCOPIC
Bilirubin Urine: NEGATIVE
Glucose, UA: NEGATIVE mg/dL
Hgb urine dipstick: NEGATIVE
Ketones, ur: NEGATIVE mg/dL
Leukocytes,Ua: NEGATIVE
Nitrite: NEGATIVE
Protein, ur: NEGATIVE mg/dL
Specific Gravity, Urine: 1.023 (ref 1.005–1.030)
pH: 5 (ref 5.0–8.0)

## 2023-11-09 LAB — RESP PANEL BY RT-PCR (RSV, FLU A&B, COVID)  RVPGX2
Influenza A by PCR: NEGATIVE
Influenza B by PCR: NEGATIVE
Resp Syncytial Virus by PCR: NEGATIVE
SARS Coronavirus 2 by RT PCR: NEGATIVE

## 2023-11-09 LAB — CBC
HCT: 49 % (ref 39.0–52.0)
Hemoglobin: 17.2 g/dL — ABNORMAL HIGH (ref 13.0–17.0)
MCH: 31.2 pg (ref 26.0–34.0)
MCHC: 35.1 g/dL (ref 30.0–36.0)
MCV: 88.8 fL (ref 80.0–100.0)
Platelets: 156 10*3/uL (ref 150–400)
RBC: 5.52 MIL/uL (ref 4.22–5.81)
RDW: 13 % (ref 11.5–15.5)
WBC: 10 10*3/uL (ref 4.0–10.5)
nRBC: 0 % (ref 0.0–0.2)

## 2023-11-09 LAB — BRAIN NATRIURETIC PEPTIDE: B Natriuretic Peptide: 6.1 pg/mL (ref 0.0–100.0)

## 2023-11-09 LAB — TROPONIN I (HIGH SENSITIVITY): Troponin I (High Sensitivity): 4 ng/L (ref <18)

## 2023-11-09 LAB — LIPASE, BLOOD: Lipase: 23 U/L (ref 11–51)

## 2023-11-09 MED ORDER — IOHEXOL 350 MG/ML SOLN
75.0000 mL | Freq: Once | INTRAVENOUS | Status: AC | PRN
  Administered 2023-11-09: 75 mL via INTRAVENOUS

## 2023-11-09 MED ORDER — FAMOTIDINE IN NACL 20-0.9 MG/50ML-% IV SOLN
20.0000 mg | Freq: Once | INTRAVENOUS | Status: AC
  Administered 2023-11-09: 20 mg via INTRAVENOUS
  Filled 2023-11-09: qty 50

## 2023-11-09 MED ORDER — KETOROLAC TROMETHAMINE 15 MG/ML IJ SOLN
15.0000 mg | Freq: Once | INTRAMUSCULAR | Status: AC
  Administered 2023-11-09: 15 mg via INTRAVENOUS
  Filled 2023-11-09: qty 1

## 2023-11-09 MED ORDER — IOHEXOL 300 MG/ML  SOLN
100.0000 mL | Freq: Once | INTRAMUSCULAR | Status: AC | PRN
  Administered 2023-11-09: 100 mL via INTRAVENOUS

## 2023-11-09 MED ORDER — MORPHINE SULFATE (PF) 4 MG/ML IV SOLN
4.0000 mg | Freq: Once | INTRAVENOUS | Status: AC
  Administered 2023-11-09: 4 mg via INTRAVENOUS
  Filled 2023-11-09: qty 1

## 2023-11-09 MED ORDER — DIPHENHYDRAMINE HCL 50 MG/ML IJ SOLN
25.0000 mg | Freq: Once | INTRAMUSCULAR | Status: AC
  Administered 2023-11-09: 25 mg via INTRAVENOUS
  Filled 2023-11-09: qty 1

## 2023-11-09 MED ORDER — PROCHLORPERAZINE EDISYLATE 10 MG/2ML IJ SOLN
10.0000 mg | Freq: Once | INTRAMUSCULAR | Status: AC
  Administered 2023-11-09: 10 mg via INTRAVENOUS
  Filled 2023-11-09: qty 2

## 2023-11-09 MED ORDER — MECLIZINE HCL 25 MG PO TABS
25.0000 mg | ORAL_TABLET | Freq: Once | ORAL | Status: AC
  Administered 2023-11-09: 25 mg via ORAL
  Filled 2023-11-09: qty 1

## 2023-11-09 MED ORDER — ONDANSETRON 4 MG PO TBDP
4.0000 mg | ORAL_TABLET | Freq: Once | ORAL | Status: AC | PRN
  Administered 2023-11-09: 4 mg via ORAL
  Filled 2023-11-09: qty 1

## 2023-11-09 MED ORDER — FAMOTIDINE 20 MG PO TABS: 20.0000 mg | ORAL_TABLET | Freq: Once | ORAL | Status: DC

## 2023-11-09 MED ORDER — LACTATED RINGERS IV BOLUS
1000.0000 mL | Freq: Once | INTRAVENOUS | Status: AC
  Administered 2023-11-09: 1000 mL via INTRAVENOUS

## 2023-11-09 MED ORDER — ONDANSETRON HCL 4 MG/2ML IJ SOLN
4.0000 mg | Freq: Once | INTRAMUSCULAR | Status: AC
  Administered 2023-11-09: 4 mg via INTRAVENOUS
  Filled 2023-11-09: qty 2

## 2023-11-09 NOTE — Discharge Instructions (Addendum)
 You are seen in the emergency department for dizziness, headache, nausea and vomiting and abdominal pain.  You had a significant workup including an MRI of your brain that did not show any signs of stroke.  You had a CTA that showed no signs of dissection or problems with the blood vessels going up to your head and neck.  Your COVID and influenza testing were negative.  Your lab work was overall normal and you had no findings concerning for heart attack.  You have been on multiple rounds of steroids which could be causing your abdominal pain, continue to take your acid reducing medications as prescribed at home.  Stay hydrated and drink plenty of fluids.  Concerned that you could have an inner ear issue.  Given that you have been on multiple rounds of steroids we will hold off on any further steroids at this time.  You are given information of how to perform Epley maneuvers which can help with inner ear issues.  Follow-up closely with your primary care physician and your ear nose and throat specialist.  Return to the emergency department for any ongoing or worsening symptoms.

## 2023-11-09 NOTE — ED Triage Notes (Addendum)
 Patient to Urgent Care with multiple complaints.  Bloating x2 weeks. Reports entire stomach feels tight and uncomfortable. Last BM this morning. Poor appetite/ nausea/ cramping. No VD. Taking pepcid .   Upper back/ shoulder/ neck pain x2-3 weeks. Taking Meloxicam / using heat. [facility]

## 2023-11-09 NOTE — ED Notes (Signed)
 See triage notes. Patient c/o abdominal pain, chest pain, back pain, cough times a few weeks.

## 2023-11-09 NOTE — ED Provider Notes (Signed)
 Chris Baldwin    CSN: [id number] Arrival date & time: 11/09/23  1147      History   Chief Complaint Chief Complaint  Patient presents with   Multiple Complaints    Abdominal bloating/ shoulder pain/ neck pain    HPI Chris Baldwin is a 48 y.o. male.  Patient presents with 2-week history of abdominal pain and bloating.  His abdomen feels tight when he stands.  He reports nausea and decreased appetite.  His symptoms are worse today.  Last bowel movement this morning.  No fever, vomiting, or diarrhea.  Patient also reports ear pain, congestion, cough today.  No chest pain or shortness of breath.  He also reports neck, shoulder, back pain for several weeks.  Patient reports he was treated by his PCP with amoxicillin , Augmentin , and Zithromax  during the month of November for ear infection and respiratory infection.  His medical history includes GERD, fatty liver, sleep apnea, morbid obesity, insomnia.    The history is provided by the patient and medical records.    Past Medical History:  Diagnosis Date   Allergy    Anxiety    Aortic atherosclerosis (HCC)    a. 03/2021 CT Abd/Pelvis - mild abd Ao Ca2+.   B12 deficiency    Chest pain    Fatty liver disease, nonalcoholic    GERD (gastroesophageal reflux disease)    Insomnia    Low testosterone  in male    Morbid obesity (HCC)    Sleep apnea    CPAP   Vitamin D  deficiency     Patient Active Problem List   Diagnosis Date Noted   Acute gastric erosion    Family history of colon cancer    Polyp of transverse colon    Polyp of sigmoid colon    Aortic atherosclerosis (HCC) 12/24/2019   Insomnia 01/21/2019   Obesity (BMI 35.0-39.9 without comorbidity) 03/01/2018   Low HDL (under 40) 03/01/2018   Fatty liver 03/01/2018   GAD (generalized anxiety disorder) 09/11/2017   Vitamin D  deficiency 07/05/2016   Obstructive sleep apnea 01/18/2016   Low testosterone  08/25/2015   Gastroesophageal reflux disease 08/18/2014     Past Surgical History:  Procedure Laterality Date   APPENDECTOMY     in young adult years   COLONOSCOPY WITH PROPOFOL  N/A 04/27/2016   Procedure: COLONOSCOPY WITH PROPOFOL ;  Surgeon: Lamar ONEIDA Holmes, MD;  Location: [facility] ENDOSCOPY;  Service: Endoscopy;  Laterality: N/A;   COLONOSCOPY WITH PROPOFOL  N/A 12/25/2020   Procedure: COLONOSCOPY WITH PROPOFOL ;  Surgeon: Janalyn Keene NOVAK, MD;  Location: [facility] ENDOSCOPY;  Service: Endoscopy;  Laterality: N/A;   ESOPHAGOGASTRODUODENOSCOPY (EGD) WITH PROPOFOL  N/A 12/25/2020   Procedure: ESOPHAGOGASTRODUODENOSCOPY (EGD) WITH PROPOFOL ;  Surgeon: Janalyn Keene NOVAK, MD;  Location: [facility] ENDOSCOPY;  Service: Endoscopy;  Laterality: N/A;   NASAL SEPTOPLASTY W/ TURBINOPLASTY Bilateral 10/13/2022   Procedure: NASAL SEPTOPLASTY WITH INFERIOR TURBINATE REDUCTION;  Surgeon: Juengel, Paul, MD;  Location: [facility];  Service: ENT;  Laterality: Bilateral;   SURGERY SCROTAL / TESTICULAR     young adult years   TONSILLECTOMY Bilateral    in childhood       Home Medications    Prior to Admission medications   Medication Sig Start Date End Date Taking? Authorizing Provider  ALPRAZolam  (XANAX ) 0.5 MG tablet Take by mouth. 10/16/23  Yes [provider]  cephALEXin  (KEFLEX ) 500 MG capsule Take 1 capsule (500 mg total) by mouth 2 (two) times daily. Patient not taking: Reported on 10/16/2023  10/13/22   Juengel, Paul, MD  cyanocobalamin (VITAMIN B12) 1000 MCG tablet Take 1,000 mcg by mouth daily.    [provider]  famotidine  (PEPCID ) 40 MG tablet Take 40 mg by mouth as needed. 10/06/20   [provider]  fluticasone  (FLONASE ) 50 MCG/ACT nasal spray Place 2 sprays into both nostrils daily. 03/01/18   Hinton Newell SQUIBB, MD  meloxicam  (MOBIC ) 15 MG tablet Take 15 mg by mouth daily.    [provider]  Multiple Vitamins-Minerals (MULTIVITAMIN MEN) TABS Take by mouth daily. Men's one a day    [provider]   predniSONE  (DELTASONE ) 10 MG tablet Start with 3 pills tomorrow. Taper over the next 6 days.  3,3,2,2,1,1. Patient not taking: Reported on 11/09/2023 10/13/22   Juengel, Paul, MD  testosterone  enanthate (DELATESTRYL) 200 MG/ML injection Inject into the muscle every 14 (fourteen) days. For IM use only    [provider]  VITAMIN D , CHOLECALCIFEROL, PO Take by mouth.    [provider]    Family History Family History  Problem Relation Age of Onset   Heart disease Mother    Diabetes Mother    Cancer Mother        colon   Arthritis Mother    Heart disease Father        heart attack - first in 42's; multiple stents   Arthritis Father    Cancer - Cervical Father    Alcohol abuse Maternal Uncle     Social History Social History   Tobacco Use   Smoking status: Former    Current packs/day: 0.00    Types: Cigarettes    Start date: 02/20/2012    Quit date: 02/19/2013    Years since quitting: 10.7    Passive exposure: Current   Smokeless tobacco: Never  Vaping Use   Vaping status: Never Used  Substance Use Topics   Alcohol use: Yes    Alcohol/week: 2.0 standard drinks of alcohol    Types: 2 Standard drinks or equivalent per week    Comment: occasional   Drug use: Not Currently     Allergies   Nexium [esomeprazole magnesium] and Sulfa antibiotics   Review of Systems Review of Systems  Constitutional:  Positive for appetite change. Negative for chills and fever.  HENT:  Positive for congestion and ear pain. Negative for sore throat.   Respiratory:  Positive for cough. Negative for shortness of breath.   Cardiovascular:  Negative for chest pain and palpitations.  Gastrointestinal:  Positive for abdominal distention, abdominal pain and nausea. Negative for blood in stool, constipation, diarrhea and vomiting.  Genitourinary:  Negative for dysuria and hematuria.     Physical Exam Triage Vital Signs ED Triage Vitals [11/09/23 1244]  Encounter Vitals Group      BP      Systolic BP Percentile      Diastolic BP Percentile      Pulse Rate 98     Resp 18     Temp 98.8 F (37.1 C)     Temp src      SpO2 97 %     Weight      Height      Head Circumference      Peak Flow      Pain Score      Pain Loc      Pain Education      Exclude from Growth Chart    No data found.  Updated Vital Signs BP 126/86  Pulse 98   Temp 98.8 F (37.1 C)   Resp 18   SpO2 97%   Visual Acuity Right Eye Distance:   Left Eye Distance:   Bilateral Distance:    Right Eye Near:   Left Eye Near:    Bilateral Near:     Physical Exam Constitutional:      General: He is not in acute distress. HENT:     Ears:     Comments: Both TMs are mildly erythematous.    Nose: Nose normal.     Mouth/Throat:     Mouth: Mucous membranes are moist.     Pharynx: Oropharynx is clear.  Cardiovascular:     Rate and Rhythm: Normal rate and regular rhythm.     Heart sounds: Normal heart sounds.  Pulmonary:     Effort: Pulmonary effort is normal. No respiratory distress.     Breath sounds: Normal breath sounds.  Abdominal:     General: Bowel sounds are normal. There is distension.     Palpations: Abdomen is soft.     Tenderness: There is abdominal tenderness in the right upper quadrant, epigastric area and left upper quadrant. There is no guarding or rebound.  Neurological:     Mental Status: He is alert.      UC Treatments / Results  Labs (all labs ordered are listed, but only abnormal results are displayed) Labs Reviewed - No data to display  EKG   Radiology No results found.  Procedures Procedures (including critical care time)  Medications Ordered in UC Medications - No data to display  Initial Impression / Assessment and Plan / UC Course  I have reviewed the triage vital signs and the nursing notes.  Pertinent labs & imaging results that were available during my care of the patient were reviewed by me and considered in my medical decision making  (see chart for details).    Upper abdominal pain, bloating, distention, nausea without vomiting, bilateral otitis media, viral symptoms.  Afebrile and vital signs are stable.  Patient's abdomen is soft when lying flat but distended on standing.  His upper abdomen is mildly tender to palpation.  Discussed treatment options, including antinausea medication and treatment of bilateral otitis media.  Offered testing including flu and COVID. Patient declines and prefers to be transferred to the ED.  Sending him to [facility] ED for evaluation. Final Clinical Impressions(s) / UC Diagnoses   Final diagnoses:  Pain of upper abdomen  Bloating  Nausea without vomiting  Bilateral otitis media, unspecified otitis media type  Viral illness     Discharge Instructions      Go to the emergency department for evaluation of your abdominal pain and other symptoms.     ED Prescriptions   None    I have reviewed the PDMP during this encounter.   Corlis Burnard DEL, NP 11/09/23 1319

## 2023-11-09 NOTE — ED Notes (Signed)
 Patient reports being acutely anxious, hot, diaphoretic after receiving compazine. Pt noted to be all of the aforementioned. No respiratory distress or hives noted. No wheezing noted. MD notified and orders placed.

## 2023-11-09 NOTE — ED Provider Notes (Signed)
 [facility] Provider Note    Event Date/Time   First MD Initiated Contact with Patient 11/09/23 1622     (approximate)   History   Abdominal Pain   HPI  Chris Baldwin is a 48 y.o. male past medical history significant for GERD, anxiety, who presents to the emergency department with multiple complaints.  Patient states that he has not been feeling well for the past 1 month but has significantly worsened over the past 2 days.  States that he has been seen by his primary care physician office and ENT.  Has completed multiple rounds of antibiotics and has been told that he has had upper respiratory infection or ear infections.  States that he has been on amoxicillin , Augmentin , azithromycin  and multiple rounds of steroids.  States that over the past 2 days he had significantly worsening dizziness and feeling off balance whenever he goes to walk.  Feeling like the room is spinning.  Denies any significant vomiting but does endorse ongoing nausea and upper abdominal pain.  Went to urgent care today and was told to come to the emergency department.  Feels like his abdomen is hurting and is worse whenever he stands up.  Denies any falls or trauma.  Denies any significant chest pain but complaining of upper and lower back pain.     Physical Exam   Triage Vital Signs: ED Triage Vitals  Encounter Vitals Group     BP 11/09/23 1359 135/88     Systolic BP Percentile --      Diastolic BP Percentile --      Pulse Rate 11/09/23 1359 94     Resp 11/09/23 1359 20     Temp 11/09/23 1359 99.1 F (37.3 C)     Temp src --      SpO2 11/09/23 1359 97 %     Weight 11/09/23 1400 (!) 322 lb 1.5 oz (146.1 kg)     Height 11/09/23 1400 6' 5 (1.956 m)     Head Circumference --      Peak Flow --      Pain Score 11/09/23 1359 6     Pain Loc --      Pain Education --      Exclude from Growth Chart --     Most recent vital signs: Vitals:   11/09/23 1941 11/10/23 0029  BP:  (!) 143/91 (!) 147/100  Pulse: 83 100  Resp: 18 19  Temp: 98.6 F (37 C) 98.8 F (37.1 C)  SpO2: 100% 100%    Physical Exam Constitutional:      Appearance: He is well-developed. He is obese. He is ill-appearing.  HENT:     Head: Atraumatic.     Right Ear: Tympanic membrane normal.     Left Ear: Tympanic membrane normal.  Eyes:     Conjunctiva/sclera: Conjunctivae normal.  Cardiovascular:     Rate and Rhythm: Regular rhythm.  Pulmonary:     Effort: No respiratory distress.     Breath sounds: No wheezing.  Abdominal:     Tenderness: There is generalized abdominal tenderness and tenderness in the epigastric area.  Musculoskeletal:     Cervical back: Normal range of motion.  Skin:    General: Skin is warm.  Neurological:     Mental Status: He is alert. Mental status is at baseline.     GCS: GCS eye subscore is 4. GCS verbal subscore is 5. GCS motor subscore is 6.  Cranial Nerves: Cranial nerves 2-12 are intact.     Sensory: Sensation is intact.     Motor: Motor function is intact.     Gait: Gait abnormal.     IMPRESSION / MDM / ASSESSMENT AND PLAN / ED COURSE  I reviewed the triage vital signs and the nursing notes.  On chart review patient has been evaluated as an outpatient, evaluated earlier today and was sent over from urgent care.  Broad differential including peripheral vertigo, vestibular neuritis, central vertigo, vertebral artery dissection, ACS, gastritis/PUD in the setting of multiple rounds of steroid use, CVA  EKG  I, Clotilda Punter, the attending physician, personally viewed and interpreted this ECG.   Rate: Normal  Rhythm: Normal sinus  Axis: Normal  Intervals: Normal  ST&T Change: None  No tachycardic or bradycardic dysrhythmias while on cardiac telemetry.  RADIOLOGY I independently reviewed imaging, my interpretation of imaging: CTA with no signs of dissection.  No signs of acute hemorrhage in the brain.  CT scan of the abdomen and pelvis  with no acute abnormalities.  MRI with no signs of CVA.  Read as no acute findings.  LABS (all labs ordered are listed, but only abnormal results are displayed) Labs interpreted as -    Labs Reviewed  COMPREHENSIVE METABOLIC PANEL - Abnormal; Notable for the following components:      Result Value   Glucose, Bld 105 (*)    All other components within normal limits  CBC - Abnormal; Notable for the following components:   Hemoglobin 17.2 (*)    All other components within normal limits  URINALYSIS, ROUTINE W REFLEX MICROSCOPIC - Abnormal; Notable for the following components:   Color, Urine YELLOW (*)    APPearance CLEAR (*)    All other components within normal limits  RESP PANEL BY RT-PCR (RSV, FLU A&B, COVID)  RVPGX2  LIPASE, BLOOD  BRAIN NATRIURETIC PEPTIDE  TROPONIN I (HIGH SENSITIVITY)     MDM    Lab work overall reassuring with no leukocytosis or significant electrolyte abnormality.  Chronically elevated hemoglobin level.  Denies any tobacco use.  COVID and influenza testing are negative.  Troponin is negative have low suspicion for ACS.  Lipase within normal limits.  Patient was treated with meclizine  and had ongoing symptoms.  Given migraine medication with IV ketorolac , Compazine  and IV fluids.  On reevaluation continued to have ongoing symptoms so obtain CTA of the head and neck and MRI of the brain.  On reevaluation feeling much better.  Able to ambulate without any significant difficulties.  Possible inner ear issue causing vertigo.  Given a prescription for meclizine  and discussed how to do Epley maneuvers.  Discussed follow-up as an outpatient with ear nose and throat specialist.  Given return precautions for any ongoing or worsening symptoms.   PROCEDURES:  Critical Care performed: No  Procedures  Patient's presentation is most consistent with acute presentation with potential threat to life or bodily function.   MEDICATIONS ORDERED IN ED: Medications   ondansetron  (ZOFRAN -ODT) disintegrating tablet 4 mg (4 mg Oral Given 11/09/23 1402)  meclizine  (ANTIVERT ) tablet 25 mg (25 mg Oral Given 11/09/23 1713)  famotidine  (PEPCID ) IVPB 20 mg premix (0 mg Intravenous Stopped 11/09/23 1744)  iohexol  (OMNIPAQUE ) 300 MG/ML solution 100 mL (100 mLs Intravenous Contrast Given 11/09/23 1724)  morphine  (PF) 4 MG/ML injection 4 mg (4 mg Intravenous Given 11/09/23 1942)  ondansetron  (ZOFRAN ) injection 4 mg (4 mg Intravenous Given 11/09/23 1954)  ketorolac  (TORADOL ) 15 MG/ML injection 15  mg (15 mg Intravenous Given 11/09/23 2127)  prochlorperazine  (COMPAZINE ) injection 10 mg (10 mg Intravenous Given 11/09/23 2132)  lactated ringers  bolus 1,000 mL (0 mLs Intravenous Stopped 11/10/23 0028)  iohexol  (OMNIPAQUE ) 350 MG/ML injection 75 mL (75 mLs Intravenous Contrast Given 11/09/23 2050)  diphenhydrAMINE  (BENADRYL ) injection 25 mg (25 mg Intravenous Given 11/09/23 2203)    FINAL CLINICAL IMPRESSION(S) / ED DIAGNOSES   Final diagnoses:  Dizzy  Acute nonintractable headache, unspecified headache type     Rx / DC Orders   ED Discharge Orders          Ordered    meclizine  (ANTIVERT ) 25 MG tablet  3 times daily PRN,   Status:  Discontinued        11/10/23 0011    ondansetron  (ZOFRAN -ODT) 4 MG disintegrating tablet  Every 8 hours PRN,   Status:  Discontinued        11/10/23 0011    meclizine  (ANTIVERT ) 25 MG tablet  3 times daily PRN        11/10/23 0027    ondansetron  (ZOFRAN -ODT) 4 MG disintegrating tablet  Every 8 hours PRN        11/10/23 0027             Note:  This document was prepared using Dragon voice recognition software and may include unintentional dictation errors.   Suzanne Kirsch, MD 11/10/23 [phone]

## 2023-11-09 NOTE — Discharge Instructions (Addendum)
 Go to the emergency department for evaluation of your abdominal pain and other symptoms.

## 2023-11-09 NOTE — ED Triage Notes (Signed)
 Pt here with RUQ abd pain x2 weeks. Pt also having shoulder, neck, and left arm discomfort. Pt was also dx with bilateral ear infxs today as well. Pt states he is having a dull pain in his chest and a lot of gas that is worse at night. Pt having a cough this morning as well. Pt denies V and D but is now having nausea.

## 2023-11-10 MED ORDER — MECLIZINE HCL 25 MG PO TABS
25.0000 mg | ORAL_TABLET | Freq: Three times a day (TID) | ORAL | 0 refills | Status: DC | PRN
Start: 2023-11-10 — End: 2023-11-10

## 2023-11-10 MED ORDER — ONDANSETRON 4 MG PO TBDP
4.0000 mg | ORAL_TABLET | Freq: Three times a day (TID) | ORAL | 0 refills | Status: DC | PRN
Start: 2023-11-10 — End: 2023-11-10

## 2023-11-10 MED ORDER — ONDANSETRON 4 MG PO TBDP
4.0000 mg | ORAL_TABLET | Freq: Three times a day (TID) | ORAL | 0 refills | Status: DC | PRN
Start: 2023-11-10 — End: 2024-04-11

## 2023-11-10 MED ORDER — MECLIZINE HCL 25 MG PO TABS
25.0000 mg | ORAL_TABLET | Freq: Three times a day (TID) | ORAL | 0 refills | Status: DC | PRN
Start: 2023-11-10 — End: 2024-04-11

## 2023-11-10 NOTE — ED Notes (Signed)
 AVS provided by edp was discussed with pt and s/o at bedside. Pt was able to verbalize understanding with no additional questions at this time. Pt ambulatory at discharge going home with wife.

## 2023-11-15 ENCOUNTER — Ambulatory Visit: Payer: BC Managed Care – PPO | Attending: Nurse Practitioner

## 2023-11-15 DIAGNOSIS — R079 Chest pain, unspecified: Secondary | ICD-10-CM

## 2023-11-15 LAB — ECHOCARDIOGRAM COMPLETE
AV Mean grad: 4 mm[Hg]
AV Peak grad: 7.1 mm[Hg]
Ao pk vel: 1.33 m/s
Area-P 1/2: 2.56 cm2
S' Lateral: 3.7 cm

## 2023-11-17 DIAGNOSIS — R42 Dizziness and giddiness: Secondary | ICD-10-CM | POA: Diagnosis not present

## 2023-11-17 DIAGNOSIS — E669 Obesity, unspecified: Secondary | ICD-10-CM | POA: Diagnosis not present

## 2023-11-17 DIAGNOSIS — R079 Chest pain, unspecified: Secondary | ICD-10-CM | POA: Diagnosis not present

## 2023-11-17 DIAGNOSIS — J329 Chronic sinusitis, unspecified: Secondary | ICD-10-CM | POA: Diagnosis not present

## 2023-11-27 ENCOUNTER — Ambulatory Visit: Payer: BC Managed Care – PPO | Admitting: Cardiology

## 2023-12-01 DIAGNOSIS — M5412 Radiculopathy, cervical region: Secondary | ICD-10-CM | POA: Diagnosis not present

## 2023-12-18 DIAGNOSIS — M5412 Radiculopathy, cervical region: Secondary | ICD-10-CM | POA: Diagnosis not present

## 2023-12-20 DIAGNOSIS — M2141 Flat foot [pes planus] (acquired), right foot: Secondary | ICD-10-CM | POA: Diagnosis not present

## 2023-12-20 DIAGNOSIS — M19071 Primary osteoarthritis, right ankle and foot: Secondary | ICD-10-CM | POA: Diagnosis not present

## 2023-12-25 DIAGNOSIS — M5412 Radiculopathy, cervical region: Secondary | ICD-10-CM | POA: Diagnosis not present

## 2023-12-26 DIAGNOSIS — J4540 Moderate persistent asthma, uncomplicated: Secondary | ICD-10-CM | POA: Diagnosis not present

## 2023-12-26 DIAGNOSIS — G4733 Obstructive sleep apnea (adult) (pediatric): Secondary | ICD-10-CM | POA: Diagnosis not present

## 2023-12-26 DIAGNOSIS — G4719 Other hypersomnia: Secondary | ICD-10-CM | POA: Diagnosis not present

## 2024-01-02 DIAGNOSIS — M5412 Radiculopathy, cervical region: Secondary | ICD-10-CM | POA: Diagnosis not present

## 2024-01-10 DIAGNOSIS — M5412 Radiculopathy, cervical region: Secondary | ICD-10-CM | POA: Diagnosis not present

## 2024-01-11 DIAGNOSIS — M79671 Pain in right foot: Secondary | ICD-10-CM | POA: Diagnosis not present

## 2024-01-11 DIAGNOSIS — S93401A Sprain of unspecified ligament of right ankle, initial encounter: Secondary | ICD-10-CM | POA: Diagnosis not present

## 2024-01-17 DIAGNOSIS — M545 Low back pain, unspecified: Secondary | ICD-10-CM | POA: Diagnosis not present

## 2024-01-18 DIAGNOSIS — K219 Gastro-esophageal reflux disease without esophagitis: Secondary | ICD-10-CM | POA: Diagnosis not present

## 2024-02-15 ENCOUNTER — Ambulatory Visit: Payer: BC Managed Care – PPO | Admitting: Nurse Practitioner

## 2024-03-19 ENCOUNTER — Other Ambulatory Visit
Admission: RE | Admit: 2024-03-19 | Discharge: 2024-03-19 | Disposition: A | Discharge status: Home / Self Care | Source: Ambulatory Visit | Attending: Gastroenterology | Admitting: Gastroenterology

## 2024-03-19 DIAGNOSIS — R1013 Epigastric pain: Secondary | ICD-10-CM | POA: Insufficient documentation

## 2024-03-19 LAB — TROPONIN I (HIGH SENSITIVITY): Troponin I (High Sensitivity): 4 ng/L (ref <18)

## 2024-03-25 DIAGNOSIS — H9203 Otalgia, bilateral: Secondary | ICD-10-CM | POA: Diagnosis not present

## 2024-03-25 DIAGNOSIS — R42 Dizziness and giddiness: Secondary | ICD-10-CM | POA: Diagnosis not present

## 2024-03-25 DIAGNOSIS — K219 Gastro-esophageal reflux disease without esophagitis: Secondary | ICD-10-CM | POA: Diagnosis not present

## 2024-03-25 DIAGNOSIS — R079 Chest pain, unspecified: Secondary | ICD-10-CM | POA: Diagnosis not present

## 2024-03-28 ENCOUNTER — Other Ambulatory Visit: Payer: Self-pay | Admitting: Infectious Diseases

## 2024-03-28 DIAGNOSIS — H9203 Otalgia, bilateral: Secondary | ICD-10-CM

## 2024-03-28 DIAGNOSIS — R079 Chest pain, unspecified: Secondary | ICD-10-CM

## 2024-04-08 ENCOUNTER — Ambulatory Visit
Admission: RE | Admit: 2024-04-08 | Discharge: 2024-04-08 | Disposition: A | Discharge status: Home / Self Care | Payer: Self-pay | Source: Ambulatory Visit | Attending: Infectious Diseases | Admitting: Infectious Diseases

## 2024-04-08 DIAGNOSIS — H9203 Otalgia, bilateral: Secondary | ICD-10-CM | POA: Insufficient documentation

## 2024-04-08 DIAGNOSIS — R079 Chest pain, unspecified: Secondary | ICD-10-CM | POA: Insufficient documentation

## 2024-04-10 ENCOUNTER — Other Ambulatory Visit
Admission: RE | Admit: 2024-04-10 | Discharge: 2024-04-10 | Disposition: A | Discharge status: Home / Self Care | Source: Ambulatory Visit | Attending: Infectious Diseases | Admitting: Infectious Diseases

## 2024-04-10 DIAGNOSIS — H663X3 Other chronic suppurative otitis media, bilateral: Secondary | ICD-10-CM | POA: Diagnosis not present

## 2024-04-10 DIAGNOSIS — R079 Chest pain, unspecified: Secondary | ICD-10-CM | POA: Insufficient documentation

## 2024-04-10 LAB — TROPONIN I (HIGH SENSITIVITY): Troponin I (High Sensitivity): 4 ng/L (ref <18)

## 2024-04-11 ENCOUNTER — Encounter: Payer: Self-pay | Admitting: Cardiology

## 2024-04-11 ENCOUNTER — Ambulatory Visit

## 2024-04-11 ENCOUNTER — Ambulatory Visit: Attending: Cardiology | Admitting: Cardiology

## 2024-04-11 VITALS — BP 108/84 | HR 73 | Ht 77.0 in | Wt 320.2 lb

## 2024-04-11 DIAGNOSIS — E782 Mixed hyperlipidemia: Secondary | ICD-10-CM | POA: Diagnosis not present

## 2024-04-11 DIAGNOSIS — R072 Precordial pain: Secondary | ICD-10-CM

## 2024-04-11 DIAGNOSIS — I498 Other specified cardiac arrhythmias: Secondary | ICD-10-CM

## 2024-04-11 MED ORDER — METOPROLOL TARTRATE 100 MG PO TABS: ORAL_TABLET | ORAL | 0 refills | Status: DC

## 2024-04-11 NOTE — Progress Notes (Signed)
 Cardiology Office Note:    Date:  04/11/2024   ID:  Chris Baldwin, DOB Nov 23, 1975, MRN [id number]  PCP:  Welton Hall, MD   [facility] Providers Cardiologist:  None     Referring MD: Welton Hall, MD   Chief Complaint  Patient presents with   Follow-up    CP/ Heart flutter/Elevated BP. Meds reviewed verbally with pt.    History of Present Illness:    Chris Baldwin is a 48 y.o. male with a hx of hyperlipidemia, anxiety, GERD who presents due to chest pain, heart flutters.  Seen about 6 months ago for similar symptoms.  Symptoms of heart flutter occur every other day lasting a few seconds.  Denies dizziness or syncope.  Chest pain symptoms are not associated with exertion, occurs sometimes when she is on his recliner.  Has been eating healthier, increasing activity with improvement in cholesterol numbers.  Walking usually helps with his symptoms of chest pain.  Father had a heart attack in his 68s.  Echocardiogram was obtained 11/2023 showing normal EF 50 to 55%, diastolic function normal. Coronary calcium score 04/2024 showed score of 0.  Past Medical History:  Diagnosis Date   Allergy    Anxiety    Aortic atherosclerosis (HCC)    a. 03/2021 CT Abd/Pelvis - mild abd Ao Ca2+.   B12 deficiency    Chest pain    Fatty liver disease, nonalcoholic    GERD (gastroesophageal reflux disease)    Insomnia    Low testosterone  in male    Morbid obesity (HCC)    Sleep apnea    CPAP   Vitamin D  deficiency     Past Surgical History:  Procedure Laterality Date   APPENDECTOMY     in young adult years   COLONOSCOPY WITH PROPOFOL  N/A 04/27/2016   Procedure: COLONOSCOPY WITH PROPOFOL ;  Surgeon: Cassie Click, MD;  Location: [facility] ENDOSCOPY;  Service: Endoscopy;  Laterality: N/A;   COLONOSCOPY WITH PROPOFOL  N/A 12/25/2020   Procedure: COLONOSCOPY WITH PROPOFOL ;  Surgeon: Irby Mannan, MD;  Location: [facility] ENDOSCOPY;  Service: Endoscopy;   Laterality: N/A;   ESOPHAGOGASTRODUODENOSCOPY (EGD) WITH PROPOFOL  N/A 12/25/2020   Procedure: ESOPHAGOGASTRODUODENOSCOPY (EGD) WITH PROPOFOL ;  Surgeon: Irby Mannan, MD;  Location: [facility] ENDOSCOPY;  Service: Endoscopy;  Laterality: N/A;   MOHS SURGERY     NASAL SEPTOPLASTY W/ TURBINOPLASTY Bilateral 10/13/2022   Procedure: NASAL SEPTOPLASTY WITH INFERIOR TURBINATE REDUCTION;  Surgeon: Mellody Sprout, MD;  Location: [facility];  Service: ENT;  Laterality: Bilateral;   SURGERY SCROTAL / TESTICULAR     young adult years   TONSILLECTOMY Bilateral    in childhood    Current Medications: Current Meds  Medication Sig   albuterol (VENTOLIN HFA) 108 (90 Base) MCG/ACT inhaler Inhale 2 puffs into the lungs.   cyanocobalamin (VITAMIN B12) 1000 MCG tablet Take 1,000 mcg by mouth daily.   metoprolol tartrate (LOPRESSOR) 100 MG tablet TAKE 1 TABLET 2 HR PRIOR TO CARDIAC PROCEDURE   Multiple Vitamins-Minerals (MULTIVITAMIN MEN) TABS Take by mouth daily. Men's one a day   pantoprazole (PROTONIX) 20 MG tablet Take 1 tablet by mouth daily.   testosterone  enanthate (DELATESTRYL) 200 MG/ML injection Inject into the muscle every 14 (fourteen) days. For IM use only   VITAMIN D , CHOLECALCIFEROL, PO Take by mouth.     Allergies:   Nexium [esomeprazole magnesium], Sulfa antibiotics, and Compazine  [prochlorperazine ]   Social History   Socioeconomic History   Marital  status: Married    Spouse name: angela    Number of children: 1   Years of education: Not on file   Highest education level: Associate degree: occupational, Scientist, product/process development, or vocational program  Occupational History   Not on file  Tobacco Use   Smoking status: Former    Current packs/day: 0.00    Types: Cigarettes    Start date: 02/20/2012    Quit date: 02/19/2013    Years since quitting: 11.1    Passive exposure: Current   Smokeless tobacco: Never  Vaping Use   Vaping status: Never Used  Substance and Sexual Activity    Alcohol use: Yes    Alcohol/week: 2.0 standard drinks of alcohol    Types: 2 Standard drinks or equivalent per week    Comment: occasional   Drug use: Not Currently   Sexual activity: Yes    Partners: Female  Other Topics Concern   Not on file  Social History Narrative   Lives in [place], married, 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   Social Drivers of Corporate investment banker Strain: Low Risk  (01/18/2024)   Received from [facility]   Overall Financial Resource Strain (CARDIA)    Difficulty of Paying Living Expenses: Not hard at all  Food Insecurity: No Food Insecurity (01/18/2024)   Received from [facility]   Hunger Vital Sign    Worried About Running Out of Food in the Last Year: Never true    Ran Out of Food in the Last Year: Never true  Transportation Needs: No Transportation Needs (01/18/2024)   Received from [facility] - Transportation    In the past 12 months, has lack of transportation kept you from medical appointments or from getting medications?: No    Lack of Transportation (Non-Medical): No  Physical Activity: Sufficiently Active (12/24/2018)   Exercise Vital Sign    Days of Exercise per Week: 7 days    Minutes of Exercise per Session: 150+ min  Stress: Stress Concern Present (12/24/2018)   [employer] of Occupational Health - Occupational Stress Questionnaire    Feeling of Stress : Rather much  Social Connections: Socially Integrated (12/24/2018)   Social Connection and Isolation Panel [NHANES]    Frequency of Communication with Friends and Family: More than three times a week    Frequency of Social Gatherings with Friends and Family: More than three times a week    Attends Religious Services: More than 4 times per year    Active Member of [employer] or Organizations: Yes    Attends Engineer, structural: More than 4  times per year    Marital Status: Married     Family History: The patient's family history includes Alcohol abuse in his maternal uncle; Arthritis in his father and mother; Cancer in his mother; Cancer - Cervical in his father; Diabetes in his mother; Heart disease in his father and mother.  ROS:   Please see the history of present illness.     All other systems reviewed and are negative.  EKGs/Labs/Other Studies Reviewed:    The following studies were reviewed today:       Recent Labs: 11/09/2023: ALT 31; B Natriuretic Peptide 6.1; BUN 16; Creatinine, Ser 1.05; Hemoglobin 17.2; Platelets 156; Potassium 4.1; Sodium 135  Recent Lipid Panel    Component Value Date/Time   CHOL 207 (  H) 07/02/2019 0813   CHOL 190 02/15/2016 1028   TRIG 274 (H) 07/02/2019 0813   HDL 36 (L) 07/02/2019 0813   HDL 31 (L) 02/15/2016 1028   CHOLHDL 5.8 (H) 07/02/2019 0813   VLDL 42 (H) 07/05/2016 0805   LDLCALC 129 (H) 07/02/2019 0813     Risk Assessment/Calculations:               Physical Exam:    VS:  BP 108/84 (BP Location: Left Arm, Patient Position: Sitting, Cuff Size: Large)   Pulse 73   Ht 6\' 5"  (1.956 m)   Wt (!) 320 lb 4 oz (145.3 kg)   SpO2 98%   BMI 37.98 kg/m     Wt Readings from Last 3 Encounters:  04/11/24 (!) 320 lb 4 oz (145.3 kg)  11/09/23 (!) 322 lb 1.5 oz (146.1 kg)  10/16/23 (!) 322 lb (146.1 kg)     GEN:  Well nourished, well developed in no acute distress HEENT: Normal NECK: No JVD; No carotid bruits CARDIAC: RRR, no murmurs, rubs, gallops RESPIRATORY:  Clear to auscultation without rales, wheezing or rhonchi  ABDOMEN: Soft, non-tender, non-distended MUSCULOSKELETAL:  No edema; No deformity  SKIN: Warm and dry NEUROLOGIC:  Alert and oriented x 3 PSYCHIATRIC:  Normal affect   ASSESSMENT:    1. Precordial pain   2. Mixed hyperlipidemia   3. Fluttering heart    PLAN:    In order of problems listed above:  Chest pain, echo with normal EF.  Obtain  coronary CTA to rule out CAD. Hyperlipidemia, coronary CTA as above, low threshold to start statin if plaque noted.  Calcium score 04/08/2024 was 0. Heart flutters, place cardiac monitor to evaluate for any significant arrhythmias.  Follow-up after cardiac testing      Medication Adjustments/Labs and Tests Ordered: Current medicines are reviewed at length with the patient today.  Concerns regarding medicines are outlined above.  Orders Placed This Encounter  Procedures   CT CORONARY MORPH W/CTA COR W/SCORE W/CA W/CM &/OR WO/CM   Basic metabolic panel with GFR   LONG TERM MONITOR (3-14 DAYS)   EKG 12-Lead   Meds ordered this encounter  Medications   metoprolol tartrate (LOPRESSOR) 100 MG tablet    Sig: TAKE 1 TABLET 2 HR PRIOR TO CARDIAC PROCEDURE    Dispense:  1 tablet    Refill:  0    Patient Instructions  Medication Instructions:  Your physician recommends that you continue on your current medications as directed. Please refer to the Current Medication list given to you today.   *If you need a refill on your cardiac medications before your next appointment, please call your pharmacy*  Lab Work: Your provider would like for you to have following labs drawn today BMP.    If you have labs (blood work) drawn today and your tests are completely normal, you will receive your results only by: MyChart Message (if you have MyChart) OR A paper copy in the mail If you have any lab test that is abnormal or we need to change your treatment, we will call you to review the results.  Testing/Procedures:  Your cardiac CT will be scheduled at:  [facility] [address] [place], [place] [address] [phone]  Please arrive 15 mins early for check-in and test prep.  There is spacious parking and easy access to the radiology department from the [facility] entrance. Please enter here and check-in with the desk attendant.  Please follow these  instructions carefully (unless otherwise directed):  An IV will be required for this test and Nitroglycerin will be given.  Hold all erectile dysfunction medications at least 3 days (72 hrs) prior to test. (Ie viagra, cialis, sildenafil, tadalafil, etc)   On the Night Before the Test: Be sure to Drink plenty of water. Do not consume any caffeinated/decaffeinated beverages or chocolate 12 hours prior to your test. Do not take any antihistamines 12 hours prior to your test.  On the Day of the Test: Drink plenty of water until 1 hour prior to the test. Do not eat any food 1 hour prior to test. You may take your regular medications prior to the test.  Take metoprolol (Lopressor) two hours prior to test. If you take Furosemide/Hydrochlorothiazide/Spironolactone/Chlorthalidone, please HOLD on the morning of the test. Patients who wear a continuous glucose monitor MUST remove the device prior to scanning. FEMALES- please wear underwire-free bra if available, avoid dresses & tight clothing      After the Test: Drink plenty of water. After receiving IV contrast, you may experience a mild flushed feeling. This is normal. On occasion, you may experience a mild rash up to 24 hours after the test. This is not dangerous. If this occurs, you can take Benadryl  25 mg, Zyrtec, Claritin, or Allegra and increase your fluid intake. (Patients taking Tikosyn should avoid Benadryl , and may take Zyrtec, Claritin, or Allegra) If you experience trouble breathing, this can be serious. If it is severe call 911 IMMEDIATELY. If it is mild, please call our office.  We will call to schedule your test 2-4 weeks out understanding that some insurance companies will need an authorization prior to the service being performed.   For more information and frequently asked questions, please visit our website : [email or web address]  For non-scheduling related questions, please contact the cardiac imaging nurse  navigator should you have any questions/concerns: Cardiac Imaging Nurse Navigators Direct Office Dial: [phone]   For scheduling needs, including cancellations and rescheduling, please call [place], [phone].     Your physician has recommended that you wear a Zio monitor.   This monitor is a medical device that records the heart's electrical activity. Doctors most often use these monitors to diagnose arrhythmias. Arrhythmias are problems with the speed or rhythm of the heartbeat. The monitor is a small device applied to your chest. You can wear one while you do your normal daily activities. While wearing this monitor if you have any symptoms to push the button and record what you felt. Once you have worn this monitor for the period of time provider prescribed (Usually 14 days), you will return the monitor device in the postage paid box. Once it is returned they will download the data collected and provide us  with a report which the provider will then review and we will call you with those results. Important tips:  Avoid showering during the first 24 hours of wearing the monitor. Avoid excessive sweating to help maximize wear time. Do not submerge the device, no hot tubs, and no swimming pools. Keep any lotions or oils away from the patch. After 24 hours you may shower with the patch on. Take brief showers with your back facing the shower head.  Do not remove patch once it has been placed because that will interrupt data and decrease adhesive wear time. Push the button when you have any symptoms and write down what you were feeling. Once you have completed wearing your monitor, remove and  place into box which has postage paid and place in your outgoing mailbox.  If for some reason you have misplaced your box then call our office and we can provide another box and/or mail it off for you.   Follow-Up: At [facility], you and your health needs are our priority.  As part of our  continuing mission to provide you with exceptional heart care, our providers are all part of one team.  This team includes your primary Cardiologist (physician) and Advanced Practice Providers or APPs (Physician Assistants and Nurse Practitioners) who all work together to provide you with the care you need, when you need it.  Your next appointment:   2 month(s)  Provider:   You may see Dr. Junnie Olives or one of the following Advanced Practice Providers on your designated Care Team:   Laneta Pintos, NP Gildardo Labrador, PA-C Varney Gentleman, PA-C Cadence Aldora, PA-C Ronald Cockayne, NP Morey Ar, NP    We recommend signing up for the patient portal called "MyChart".  Sign up information is provided on this After Visit Summary.  MyChart is used to connect with patients for Virtual Visits (Telemedicine).  Patients are able to view lab/test results, encounter notes, upcoming appointments, etc.  Non-urgent messages can be sent to your provider as well.   To learn more about what you can do with MyChart, go to [email or web address].      Signed, Constancia Delton, MD  04/11/2024 10:07 AM    [facility]

## 2024-04-11 NOTE — Patient Instructions (Signed)
 Medication Instructions:  Your physician recommends that you continue on your current medications as directed. Please refer to the Current Medication list given to you today.   *If you need a refill on your cardiac medications before your next appointment, please call your pharmacy*  Lab Work: Your provider would like for you to have following labs drawn today BMP.    If you have labs (blood work) drawn today and your tests are completely normal, you will receive your results only by: MyChart Message (if you have MyChart) OR A paper copy in the mail If you have any lab test that is abnormal or we need to change your treatment, we will call you to review the results.  Testing/Procedures:  Your cardiac CT will be scheduled at:  [facility] [address] [place], [place] [address] [phone]  Please arrive 15 mins early for check-in and test prep.  There is spacious parking and easy access to the radiology department from the [facility] entrance. Please enter here and check-in with the desk attendant.    Please follow these instructions carefully (unless otherwise directed):  An IV will be required for this test and Nitroglycerin will be given.  Hold all erectile dysfunction medications at least 3 days (72 hrs) prior to test. (Ie viagra, cialis, sildenafil, tadalafil, etc)   On the Night Before the Test: Be sure to Drink plenty of water. Do not consume any caffeinated/decaffeinated beverages or chocolate 12 hours prior to your test. Do not take any antihistamines 12 hours prior to your test.  On the Day of the Test: Drink plenty of water until 1 hour prior to the test. Do not eat any food 1 hour prior to test. You may take your regular medications prior to the test.  Take metoprolol (Lopressor) two hours prior to test. If you take Furosemide/Hydrochlorothiazide/Spironolactone/Chlorthalidone, please HOLD on the morning of the  test. Patients who wear a continuous glucose monitor MUST remove the device prior to scanning. FEMALES- please wear underwire-free bra if available, avoid dresses & tight clothing      After the Test: Drink plenty of water. After receiving IV contrast, you may experience a mild flushed feeling. This is normal. On occasion, you may experience a mild rash up to 24 hours after the test. This is not dangerous. If this occurs, you can take Benadryl  25 mg, Zyrtec, Claritin, or Allegra and increase your fluid intake. (Patients taking Tikosyn should avoid Benadryl , and may take Zyrtec, Claritin, or Allegra) If you experience trouble breathing, this can be serious. If it is severe call 911 IMMEDIATELY. If it is mild, please call our office.  We will call to schedule your test 2-4 weeks out understanding that some insurance companies will need an authorization prior to the service being performed.   For more information and frequently asked questions, please visit our website : [email or web address]  For non-scheduling related questions, please contact the cardiac imaging nurse navigator should you have any questions/concerns: Cardiac Imaging Nurse Navigators Direct Office Dial: [phone]   For scheduling needs, including cancellations and rescheduling, please call [place], [phone].     Your physician has recommended that you wear a Zio monitor.   This monitor is a medical device that records the heart's electrical activity. Doctors most often use these monitors to diagnose arrhythmias. Arrhythmias are problems with the speed or rhythm of the heartbeat. The monitor is a small device applied to your chest. You can wear one while you  do your normal daily activities. While wearing this monitor if you have any symptoms to push the button and record what you felt. Once you have worn this monitor for the period of time provider prescribed (Usually 14 days), you will return the monitor  device in the postage paid box. Once it is returned they will download the data collected and provide us  with a report which the provider will then review and we will call you with those results. Important tips:  Avoid showering during the first 24 hours of wearing the monitor. Avoid excessive sweating to help maximize wear time. Do not submerge the device, no hot tubs, and no swimming pools. Keep any lotions or oils away from the patch. After 24 hours you may shower with the patch on. Take brief showers with your back facing the shower head.  Do not remove patch once it has been placed because that will interrupt data and decrease adhesive wear time. Push the button when you have any symptoms and write down what you were feeling. Once you have completed wearing your monitor, remove and place into box which has postage paid and place in your outgoing mailbox.  If for some reason you have misplaced your box then call our office and we can provide another box and/or mail it off for you.   Follow-Up: At [facility], you and your health needs are our priority.  As part of our continuing mission to provide you with exceptional heart care, our providers are all part of one team.  This team includes your primary Cardiologist (physician) and Advanced Practice Providers or APPs (Physician Assistants and Nurse Practitioners) who all work together to provide you with the care you need, when you need it.  Your next appointment:   2 month(s)  Provider:   You may see Dr. Junnie Olives or one of the following Advanced Practice Providers on your designated Care Team:   Laneta Pintos, NP Gildardo Labrador, PA-C Varney Gentleman, PA-C Cadence Halesite, PA-C Ronald Cockayne, NP Morey Ar, NP    We recommend signing up for the patient portal called "MyChart".  Sign up information is provided on this After Visit Summary.  MyChart is used to connect with patients for Virtual Visits (Telemedicine).  Patients  are able to view lab/test results, encounter notes, upcoming appointments, etc.  Non-urgent messages can be sent to your provider as well.   To learn more about what you can do with MyChart, go to [email or web address].

## 2024-04-24 ENCOUNTER — Telehealth (HOSPITAL_COMMUNITY): Payer: Self-pay | Admitting: *Deleted

## 2024-04-24 NOTE — Telephone Encounter (Signed)
 Reaching out to patient to offer assistance regarding upcoming cardiac imaging study; pt verbalizes understanding of appt date/time, parking situation and where to check in, pre-test NPO status and medications ordered, and verified current allergies; name and call back number provided for further questions should they arise Chris Frame RN Navigator Cardiac Imaging [facility] [phone] office [phone] cell

## 2024-04-25 ENCOUNTER — Ambulatory Visit (HOSPITAL_COMMUNITY)
Admission: RE | Admit: 2024-04-25 | Discharge: 2024-04-25 | Disposition: A | Discharge status: Home / Self Care | Source: Ambulatory Visit | Attending: Cardiology | Admitting: Cardiology

## 2024-04-25 DIAGNOSIS — R072 Precordial pain: Secondary | ICD-10-CM

## 2024-04-25 DIAGNOSIS — I2510 Atherosclerotic heart disease of native coronary artery without angina pectoris: Secondary | ICD-10-CM | POA: Diagnosis not present

## 2024-04-25 MED ORDER — IOHEXOL 350 MG/ML SOLN
100.0000 mL | Freq: Once | INTRAVENOUS | Status: AC | PRN
  Administered 2024-04-25: 100 mL via INTRAVENOUS

## 2024-04-25 MED ORDER — NITROGLYCERIN 0.4 MG SL SUBL
0.8000 mg | SUBLINGUAL_TABLET | Freq: Once | SUBLINGUAL | Status: AC
  Administered 2024-04-25: 0.8 mg via SUBLINGUAL

## 2024-04-26 ENCOUNTER — Other Ambulatory Visit: Payer: Self-pay

## 2024-04-26 ENCOUNTER — Ambulatory Visit
Admission: RE | Admit: 2024-04-26 | Discharge: 2024-04-26 | Disposition: A | Discharge status: Home / Self Care | Attending: Gastroenterology | Admitting: Gastroenterology

## 2024-04-26 ENCOUNTER — Ambulatory Visit: Admitting: Anesthesiology

## 2024-04-26 ENCOUNTER — Encounter
Admission: RE | Disposition: A | Discharge status: Home / Self Care | Payer: Self-pay | Source: Home / Self Care | Attending: Gastroenterology

## 2024-04-26 DIAGNOSIS — Z6837 Body mass index (BMI) 37.0-37.9, adult: Secondary | ICD-10-CM | POA: Diagnosis not present

## 2024-04-26 DIAGNOSIS — Z800 Family history of malignant neoplasm of digestive organs: Secondary | ICD-10-CM | POA: Diagnosis not present

## 2024-04-26 DIAGNOSIS — G4733 Obstructive sleep apnea (adult) (pediatric): Secondary | ICD-10-CM | POA: Insufficient documentation

## 2024-04-26 DIAGNOSIS — Z9049 Acquired absence of other specified parts of digestive tract: Secondary | ICD-10-CM | POA: Diagnosis not present

## 2024-04-26 DIAGNOSIS — K319 Disease of stomach and duodenum, unspecified: Secondary | ICD-10-CM | POA: Diagnosis not present

## 2024-04-26 DIAGNOSIS — E538 Deficiency of other specified B group vitamins: Secondary | ICD-10-CM | POA: Diagnosis not present

## 2024-04-26 DIAGNOSIS — R1013 Epigastric pain: Secondary | ICD-10-CM | POA: Insufficient documentation

## 2024-04-26 DIAGNOSIS — E6689 Other obesity not elsewhere classified: Secondary | ICD-10-CM | POA: Insufficient documentation

## 2024-04-26 DIAGNOSIS — F419 Anxiety disorder, unspecified: Secondary | ICD-10-CM | POA: Insufficient documentation

## 2024-04-26 DIAGNOSIS — K219 Gastro-esophageal reflux disease without esophagitis: Secondary | ICD-10-CM | POA: Insufficient documentation

## 2024-04-26 DIAGNOSIS — K3189 Other diseases of stomach and duodenum: Secondary | ICD-10-CM | POA: Diagnosis not present

## 2024-04-26 DIAGNOSIS — Z8711 Personal history of peptic ulcer disease: Secondary | ICD-10-CM | POA: Diagnosis not present

## 2024-04-26 SURGERY — EGD (ESOPHAGOGASTRODUODENOSCOPY)
Anesthesia: General

## 2024-04-26 MED ORDER — FENTANYL CITRATE (PF) 100 MCG/2ML IJ SOLN
INTRAMUSCULAR | Status: DC | PRN
  Administered 2024-04-26: 50 ug via INTRAVENOUS

## 2024-04-26 MED ORDER — FENTANYL CITRATE (PF) 100 MCG/2ML IJ SOLN
INTRAMUSCULAR | Status: AC
  Filled 2024-04-26: qty 2

## 2024-04-26 MED ORDER — PROPOFOL 10 MG/ML IV BOLUS
INTRAVENOUS | Status: AC
  Filled 2024-04-26: qty 40

## 2024-04-26 MED ORDER — LIDOCAINE HCL (CARDIAC) PF 100 MG/5ML IV SOSY
PREFILLED_SYRINGE | INTRAVENOUS | Status: DC | PRN
  Administered 2024-04-26: 100 mg via INTRAVENOUS

## 2024-04-26 MED ORDER — SODIUM CHLORIDE 0.9 % IV SOLN: INTRAVENOUS | Status: DC

## 2024-04-26 MED ORDER — LIDOCAINE HCL (PF) 2 % IJ SOLN
INTRAMUSCULAR | Status: AC
  Filled 2024-04-26: qty 5

## 2024-04-26 MED ORDER — PROPOFOL 500 MG/50ML IV EMUL
INTRAVENOUS | Status: DC | PRN
  Administered 2024-04-26 (×2): 100 mg via INTRAVENOUS

## 2024-04-26 NOTE — Anesthesia Postprocedure Evaluation (Signed)
 Anesthesia Post Note  Patient: Chris Baldwin  Procedure(s) Performed: EGD (ESOPHAGOGASTRODUODENOSCOPY)  Patient location during evaluation: PACU Anesthesia Type: General Level of consciousness: awake and awake and alert Pain management: satisfactory to patient Vital Signs Assessment: post-procedure vital signs reviewed and stable Respiratory status: spontaneous breathing Cardiovascular status: stable Anesthetic complications: no   No notable events documented.   Last Vitals:  Vitals:   04/26/24 1011 04/26/24 1021  BP: (!) 132/91 (!) 136/92  Pulse: 71 69  Resp: 20 16  Temp:    SpO2: 95% 97%    Last Pain:  Vitals:   04/26/24 1021  TempSrc:   PainSc: 0-No pain                 VAN STAVEREN,Ozzy Bohlken

## 2024-04-26 NOTE — Anesthesia Preprocedure Evaluation (Signed)
 Anesthesia Evaluation  Patient identified by MRN, date of birth, ID band Patient awake    Reviewed: Allergy & Precautions, NPO status , Patient's Chart, lab work & pertinent test results  Airway Mallampati: III       Dental  (+) Teeth Intact   Pulmonary sleep apnea and Continuous Positive Airway Pressure Ventilation , former smoker    + decreased breath sounds      Cardiovascular Exercise Tolerance: Good  Rhythm:Regular Rate:Normal     Neuro/Psych   Anxiety        GI/Hepatic Neg liver ROS, PUD,GERD  Medicated,,  Endo/Other    Class 4 obesity  Renal/GU negative Renal ROS  negative genitourinary   Musculoskeletal   Abdominal  (+) + obese  Peds  Hematology   Anesthesia Other Findings   Reproductive/Obstetrics                              Anesthesia Physical Anesthesia Plan  ASA: 3  Anesthesia Plan: General   Post-op Pain Management:    Induction: Intravenous  PONV Risk Score and Plan:   Airway Management Planned: Natural Airway and Nasal Cannula  Additional Equipment:   Intra-op Plan:   Post-operative Plan:   Informed Consent: I have reviewed the patients History and Physical, chart, labs and discussed the procedure including the risks, benefits and alternatives for the proposed anesthesia with the patient or authorized representative who has indicated his/her understanding and acceptance.       Plan Discussed with: CRNA  Anesthesia Plan Comments:          Anesthesia Quick Evaluation

## 2024-04-26 NOTE — Transfer of Care (Signed)
 Immediate Anesthesia Transfer of Care Note  Patient: Chris Baldwin  Procedure(s) Performed: EGD (ESOPHAGOGASTRODUODENOSCOPY)  Patient Location: PACU  Anesthesia Type:MAC  Level of Consciousness: awake and alert   Airway & Oxygen Therapy: Patient Spontanous Breathing  Post-op Assessment: Report given to RN  Post vital signs: stable  Last Vitals:  Vitals Value Taken Time  BP    Temp    Pulse 73 04/26/24 10:02  Resp 10 04/26/24 10:02  SpO2 96 % 04/26/24 10:02  Vitals shown include unfiled device data.  Last Pain:  Vitals:   04/26/24 1001  TempSrc: Tympanic  PainSc: Asleep         Complications: No notable events documented.

## 2024-04-26 NOTE — Interval H&P Note (Signed)
 History and Physical Interval Note:  04/26/2024 9:47 AM  Chris Baldwin  has presented today for surgery, with the diagnosis of gerd.  The various methods of treatment have been discussed with the patient and family. After consideration of risks, benefits and other options for treatment, the patient has consented to  Procedure(s): EGD (ESOPHAGOGASTRODUODENOSCOPY) (N/A) as a surgical intervention.  The patient's history has been reviewed, patient examined, no change in status, stable for surgery.  I have reviewed the patient's chart and labs.  Questions were answered to the patient's satisfaction.     Shane Darling  Ok to proceed with EGD

## 2024-04-26 NOTE — H&P (Signed)
 Outpatient short stay form Pre-procedure 04/26/2024  Shane Darling, MD  Primary Physician: Welton Hall, MD  Reason for visit:  GERD/Dyspepsia  History of present illness:    48 y/o gentleman with history of anxiety, OSA, and b12 deficiency here for EGD for GERD/Dyspeptic symptoms. No blood thinners. Mother with colon cancer. History of appendectomy.    Current Facility-Administered Medications:    0.9 %  sodium chloride  infusion, , Intravenous, Continuous, Citlaly Camplin, Leanora Prophet, MD  Medications Prior to Admission  Medication Sig Dispense Refill Last Dose/Taking   albuterol (VENTOLIN HFA) 108 (90 Base) MCG/ACT inhaler Inhale 2 puffs into the lungs.   04/25/2024   ALPRAZolam  (XANAX ) 0.5 MG tablet Take by mouth.   04/25/2024   cyanocobalamin (VITAMIN B12) 1000 MCG tablet Take 1,000 mcg by mouth daily.   04/25/2024   Multiple Vitamins-Minerals (MULTIVITAMIN MEN) TABS Take by mouth daily. Men's one a day   04/25/2024   pantoprazole (PROTONIX) 20 MG tablet Take 1 tablet by mouth daily.   04/25/2024   testosterone  enanthate (DELATESTRYL) 200 MG/ML injection Inject into the muscle every 14 (fourteen) days. For IM use only   Past Week   VITAMIN D , CHOLECALCIFEROL, PO Take by mouth.   04/25/2024   metoprolol  tartrate (LOPRESSOR ) 100 MG tablet TAKE 1 TABLET 2 HR PRIOR TO CARDIAC PROCEDURE (Patient not taking: Reported on 04/26/2024) 1 tablet 0 Not Taking     Allergies  Allergen Reactions   Nexium [Esomeprazole Magnesium] Other (See Comments)    Brand name Brand name Brand name Brand name Brand name   Sulfa Antibiotics Itching   Compazine  [Prochlorperazine ] Anxiety    Pt anxious, restless, diaphoretic, experiencing hot flashes after IV compazine      Past Medical History:  Diagnosis Date   Allergy    Anxiety    Aortic atherosclerosis (HCC)    a. 03/2021 CT Abd/Pelvis - mild abd Ao Ca2+.   B12 deficiency    Chest pain    Fatty liver disease, nonalcoholic    GERD  (gastroesophageal reflux disease)    Insomnia    Low testosterone  in male    Morbid obesity (HCC)    Sleep apnea    CPAP   Vitamin D  deficiency     Review of systems:  Otherwise negative.    Physical Exam  Gen: Alert, oriented. Appears stated age.  HEENT: PERRLA. Lungs: No respiratory distress CV: RRR Abd: soft, benign, no masses Ext: No edema    Planned procedures: Proceed with EGD. The patient understands the nature of the planned procedure, indications, risks, alternatives and potential complications including but not limited to bleeding, infection, perforation, damage to internal organs and possible oversedation/side effects from anesthesia. The patient agrees and gives consent to proceed.  Please refer to procedure notes for findings, recommendations and patient disposition/instructions.     Shane Darling, MD [facility]

## 2024-04-26 NOTE — Op Note (Signed)
 [facility]enter[facility] Patient Name: Chris Baldwin Procedure Date: 04/26/2024 9:34 AM MRN: [id number] Account #: [id number] Date of Birth: 03-11-1976 Admit Type: Outpatient Age: 48 Room: [facility] ENDO ROOM 3 Gender: Male Note Status: Finalized Instrument Name: Upper Endoscope [id number] Procedure:             Upper GI endoscopy Indications:           Dyspepsia, Gastro-esophageal reflux disease Providers:             Leida Puna MD, MD Referring MD:          Lavelle Posey. Harwood Lingo (Referring MD) Medicines:             Monitored Anesthesia Care Complications:         No immediate complications. Estimated blood loss:                         Minimal. Procedure:             Pre-Anesthesia Assessment:                        - Prior to the procedure, a History and Physical was                         performed, and patient medications and allergies were                         reviewed. The patient is competent. The risks and                         benefits of the procedure and the sedation options and                         risks were discussed with the patient. All questions                         were answered and informed consent was obtained.                         Patient identification and proposed procedure were                         verified by the physician, the nurse, the                         anesthesiologist, the anesthetist and the technician                         in the endoscopy suite. Mental Status Examination:                         alert and oriented. Airway Examination: normal                         oropharyngeal airway and neck mobility. Respiratory                         Examination: clear to auscultation. CV Examination:  normal. Prophylactic Antibiotics: The patient does not                         require prophylactic antibiotics. Prior                         Anticoagulants: The patient has taken no  anticoagulant                         or antiplatelet agents. ASA Grade Assessment: III - A                         patient with severe systemic disease. After reviewing                         the risks and benefits, the patient was deemed in                         satisfactory condition to undergo the procedure. The                         anesthesia plan was to use monitored anesthesia care                         (MAC). Immediately prior to administration of                         medications, the patient was re-assessed for adequacy                         to receive sedatives. The heart rate, respiratory                         rate, oxygen saturations, blood pressure, adequacy of                         pulmonary ventilation, and response to care were                         monitored throughout the procedure. The physical                         status of the patient was re-assessed after the                         procedure.                        After obtaining informed consent, the endoscope was                         passed under direct vision. Throughout the procedure,                         the patient's blood pressure, pulse, and oxygen                         saturations were monitored continuously. The Endoscope  was introduced through the mouth, and advanced to the                         second part of duodenum. The upper GI endoscopy was                         accomplished without difficulty. The patient tolerated                         the procedure well. Findings:      The examined esophagus was normal.      The entire examined stomach was normal. Biopsies were taken with a cold       forceps for Helicobacter pylori testing. Estimated blood loss was       minimal.      The examined duodenum was normal. Impression:            - Normal esophagus.                        - Normal stomach. Biopsied.                        - Normal examined  duodenum. Recommendation:        - Discharge patient to home.                        - Resume previous diet.                        - Continue present medications.                        - Await pathology results.                        - Return to referring physician as previously                         scheduled. Procedure Code(s):     --- Professional ---                        [id number], Esophagogastroduodenoscopy, flexible,                         transoral; with biopsy, single or multiple Diagnosis Code(s):     --- Professional ---                        R10.13, Epigastric pain                        K21.9, Gastro-esophageal reflux disease without                         esophagitis CPT copyright 2022 American Medical Association. All rights reserved. The codes documented in this report are preliminary and upon coder review may  be revised to meet current compliance requirements. Leida Puna MD, MD 04/26/2024 10:02:36 AM Number of Addenda: 0 Note Initiated On: 04/26/2024 9:34 AM Estimated Blood Loss:  Estimated blood loss was minimal.      [facility]

## 2024-04-29 LAB — SURGICAL PATHOLOGY

## 2024-05-05 ENCOUNTER — Ambulatory Visit: Payer: Self-pay | Admitting: Cardiology

## 2024-05-06 DIAGNOSIS — G4733 Obstructive sleep apnea (adult) (pediatric): Secondary | ICD-10-CM | POA: Diagnosis not present

## 2024-05-06 MED ORDER — ATORVASTATIN CALCIUM 40 MG PO TABS: 40.0000 mg | ORAL_TABLET | Freq: Every day | ORAL | 3 refills | Status: DC

## 2024-05-06 MED ORDER — ASPIRIN 81 MG PO TBEC: 81.0000 mg | DELAYED_RELEASE_TABLET | Freq: Every day | ORAL | Status: AC

## 2024-05-08 DIAGNOSIS — R072 Precordial pain: Secondary | ICD-10-CM | POA: Diagnosis not present

## 2024-05-10 DIAGNOSIS — R072 Precordial pain: Secondary | ICD-10-CM

## 2024-06-05 DIAGNOSIS — G4733 Obstructive sleep apnea (adult) (pediatric): Secondary | ICD-10-CM | POA: Diagnosis not present

## 2024-06-14 ENCOUNTER — Ambulatory Visit: Admitting: Cardiology

## 2024-06-20 DIAGNOSIS — R531 Weakness: Secondary | ICD-10-CM | POA: Diagnosis not present

## 2024-06-27 ENCOUNTER — Encounter: Payer: Self-pay | Admitting: Nurse Practitioner

## 2024-06-27 ENCOUNTER — Ambulatory Visit: Attending: Nurse Practitioner | Admitting: Physician Assistant

## 2024-06-27 VITALS — BP 114/84 | HR 68 | Ht 77.0 in | Wt 322.0 lb

## 2024-06-27 DIAGNOSIS — I2510 Atherosclerotic heart disease of native coronary artery without angina pectoris: Secondary | ICD-10-CM

## 2024-06-27 DIAGNOSIS — R002 Palpitations: Secondary | ICD-10-CM | POA: Diagnosis not present

## 2024-06-27 DIAGNOSIS — E785 Hyperlipidemia, unspecified: Secondary | ICD-10-CM | POA: Diagnosis not present

## 2024-06-27 DIAGNOSIS — R072 Precordial pain: Secondary | ICD-10-CM

## 2024-06-27 MED ORDER — ATORVASTATIN CALCIUM 40 MG PO TABS: 40.0000 mg | ORAL_TABLET | Freq: Every day | ORAL | 3 refills | Status: DC

## 2024-06-27 NOTE — Patient Instructions (Signed)
 Medication Instructions:  RESTART Atorvastatin  40 mg once daily  *If you need a refill on your cardiac medications before your next appointment, please call your pharmacy*  Lab Work: None ordered If you have labs (blood work) drawn today and your tests are completely normal, you will receive your results only by: MyChart Message (if you have MyChart) OR A paper copy in the mail If you have any lab test that is abnormal or we need to change your treatment, we will call you to review the results.  Testing/Procedures: None ordered  Follow-Up: At [facility], you and your health needs are our priority.  As part of our continuing mission to provide you with exceptional heart care, our providers are all part of one team.  This team includes your primary Cardiologist (physician) and Advanced Practice Providers or APPs (Physician Assistants and Nurse Practitioners) who all work together to provide you with the care you need, when you need it.  Your next appointment:   6 month(s)  Provider:   You may see Dr. Darliss or one of the following Advanced Practice Providers on your designated Care Team:   Lesley Maffucci, PA-C  We recommend signing up for the patient portal called MyChart.  Sign up information is provided on this After Visit Summary.  MyChart is used to connect with patients for Virtual Visits (Telemedicine).  Patients are able to view lab/test results, encounter notes, upcoming appointments, etc.  Non-urgent messages can be sent to your provider as well.   To learn more about what you can do with MyChart, go to [email or web address].

## 2024-06-27 NOTE — Progress Notes (Signed)
 Cardiology Office Note    Date:  06/27/2024   ID:  SULLY, DYMENT [birth year], MRN [id number]  PCP:  Epifanio Alm HERO, MD  Cardiologist:  Redell Cave, MD  Electrophysiologist:  None   Chief Complaint: Follow up testing  History of Present Illness:   Chris Baldwin is a 48 y.o. male with history of GERD, fatty liver disease, OSA, morbid obesity, vitamin D  deficiency, insomnia, anxiety, hypotestosteronism, aortic atherosclerosis, nonobstructive CAD and vitamin B12 deficiency who presents for follow up on follow up on testing.     Patient was seen in the ED 01/2023 with complaints of malaise, fatigue, increased dyspnea on exertion, and chest tightness.  EKG and troponins were normal.  Pain was felt to be noncardiac and patient was discharged home.  In 08/2023, patient developed chest and nasal/sinus congestion associated with mild, focal, left chest and left subscapular discomfort.  This is in the setting of upper respiratory congestion with cough.  He was seen in urgent care and referred to the ED where EKG showed sinus rhythm with poor R wave progression.  Troponin was normal and chest x-ray was unremarkable.  Symptoms were felt to be consistent with cervical radiculopathy versus other musculoskeletal etiology.  He established with our practice 10/2023 and had no recurrence of symptoms.  He did report occasional brief palpitations.  Echo was obtained and demonstrated EF 50 to 55%, no RWMA, and no significant valvular abnormalities.  Coronary calcium  score was obtained 04/2024 and showed a score of 0.   Patient was most recently seen by Dr. Cave 04/11/2024 and reported symptoms of chest pain not associated with exertion and occasional heart flutter.  Coronary CTA was obtained 04/2024 and demonstrated mild nonobstructive CAD (25 to 49%).  Long-term monitor was completed 05/2024 and showed predominantly sinus rhythm with rare PVCs/PACs and no significant  arrhythmia.  Patient presents today overall doing well from a cardiac perspective.  He is without symptoms of angina or cardiac decompensation.  He denies any further symptoms of chest pain or heart flutter.  He reports that he suspects his prior symptoms were secondary to reflux which he is working to manage with his PCP.  He has been working on diet by eating more fruits and vegetables and cutting back on red meats.  He has been hoping that this will help his cholesterol numbers and has not yet started atorvastatin .  He denies chest pain, palpitations, shortness of breath, dizziness, and lower extremity swelling.  Labs independently reviewed: 06/20/2024 Hgb 17.3, HCT 50.1, platelet 182, sodium 141, potassium 4.3, BUN 12, creatinine 1.1, normal LFTs, mag 2.0 08/25/2023 TC 196, TG 227, HDL 33, LDL 117  Objective   Past Medical History:  Diagnosis Date   Allergy    Anxiety    Aortic atherosclerosis (HCC)    a. 03/2021 CT Abd/Pelvis - mild abd Ao Ca2+.   B12 deficiency    Chest pain    Fatty liver disease, nonalcoholic    GERD (gastroesophageal reflux disease)    History of echocardiogram    a. 11/2023 Echo: EF 50-55%, no significant valvular dzs.   Insomnia    Low testosterone  in male    Morbid obesity (HCC)    Nonobstructive CAD (coronary artery disease)    a. 04/2024 Cardiac CT: Ca2+ = 0; b. 05/2024 Cor CTA: LM <25, LAD 25-49%, LCX small, scattered plaque, RCA <25. Ca2+ = 1.   Palpitations    a. 05/2024 Zio: sinus rhythm.  No significant  arrhythmias or pauses.   Sleep apnea    CPAP   Vitamin D  deficiency     Current Medications: Current Meds  Medication Sig   albuterol (VENTOLIN HFA) 108 (90 Base) MCG/ACT inhaler Inhale 2 puffs into the lungs.   aspirin  EC 81 MG tablet Take 1 tablet (81 mg total) by mouth daily. Swallow whole.   cyanocobalamin (VITAMIN B12) 1000 MCG tablet Take 1,000 mcg by mouth daily.   magnesium gluconate (MAGONATE) 500 MG tablet Take 500 mg by mouth daily.  (Patient taking differently: Take 500 mg by mouth 2 times daily at 12 noon and 4 pm.)   Multiple Vitamins-Minerals (MULTIVITAMIN MEN) TABS Take by mouth daily. Men's one a day   pantoprazole (PROTONIX) 20 MG tablet Take 1 tablet by mouth daily. (Patient taking differently: Take 1 tablet by mouth 2 (two) times daily.)   testosterone  enanthate (DELATESTRYL) 200 MG/ML injection Inject into the muscle every 14 (fourteen) days. For IM use only    Allergies:   Nexium [esomeprazole magnesium], Sulfa antibiotics, and Compazine  [prochlorperazine ]   Social History   Socioeconomic History   Marital status: Married    Spouse name: angela    Number of children: 1   Years of education: Not on file   Highest education level: Associate degree: occupational, Scientist, product/process development, or vocational program  Occupational History   Not on file  Tobacco Use   Smoking status: Former    Current packs/day: 0.00    Types: Cigarettes    Start date: 02/20/2012    Quit date: 02/19/2013    Years since quitting: 11.3    Passive exposure: Current   Smokeless tobacco: Never  Vaping Use   Vaping status: Never Used  Substance and Sexual Activity   Alcohol use: Yes    Alcohol/week: 2.0 standard drinks of alcohol    Types: 2 Standard drinks or equivalent per week    Comment: occasional   Drug use: Not Currently   Sexual activity: Yes    Partners: Female  Other Topics Concern   Not on file  Social History Narrative   Lives in [place], married, 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   Social Drivers of Corporate investment banker Strain: Low Risk  (01/18/2024)   Received from [facility]   Overall Financial Resource Strain (CARDIA)    Difficulty of Paying Living Expenses: Not hard at all  Food Insecurity: No Food Insecurity (01/18/2024)   Received from [facility]   Hunger Vital Sign    Within the past 12 months, you  worried that your food would run out before you got the money to buy more.: Never true    Within the past 12 months, the food you bought just didn't last and you didn't have money to get more.: Never true  Transportation Needs: No Transportation Needs (01/18/2024)   Received from [facility] - Transportation    In the past 12 months, has lack of transportation kept you from medical appointments or from getting medications?: No    Lack of Transportation (Non-Medical): No  Physical Activity: Sufficiently Active (12/24/2018)   Exercise Vital Sign    Days of Exercise per Week: 7 days    Minutes of Exercise per Session: 150+ min  Stress: Stress Concern Present (12/24/2018)   [employer] of Occupational Health - Occupational Stress Questionnaire    Feeling of  Stress : Rather much  Social Connections: Socially Integrated (12/24/2018)   Social Connection and Isolation Panel    Frequency of Communication with Friends and Family: More than three times a week    Frequency of Social Gatherings with Friends and Family: More than three times a week    Attends Religious Services: More than 4 times per year    Active Member of [employer] or Organizations: Yes    Attends Engineer, structural: More than 4 times per year    Marital Status: Married     Family History:  The patient's family history includes Alcohol abuse in his maternal uncle; Arthritis in his father and mother; Cancer in his mother; Cancer - Cervical in his father; Diabetes in his mother; Heart disease in his father and mother.  ROS:   12-point review of systems is negative unless otherwise noted in the HPI.  EKGs/Other Studies Reviewed:    Studies reviewed were summarized above. The additional studies were reviewed today:  05/2024 Long term monitor Heart rate range 39-147.  Average heart rate 65-72. No atrial fibrillation or atrial flutter. No significant or sustained arrhythmias.  04/2024 Coronary  CTA 1. Mild CAD in a branch of the first diagonal artery, 25-49% stenosis, CADRADS 2. Otherwise, minimal CAD. 2. Total plaque volume 91 mm3 (calcified plaque 0 mm3; non-calcified plaque 91 mm3), which is 56th percentile for age- and sex-matched controls. TPV is mild. 3. Coronary calcium  score is 1, which places the patient in the 68th percentile for age and sex matched control. 4. Normal coronary origins with right dominance.  11/2023 Echo complete 1. Left ventricular ejection fraction, by estimation, is 50 to 55%. Left  ventricular ejection fraction by PLAX is 51 %. The left ventricle has low  normal function. The left ventricle has no regional wall motion  abnormalities. Left ventricular diastolic  parameters were normal. The average left ventricular global longitudinal  strain is -16.4 %.   2. Right ventricular systolic function is normal. The right ventricular  size is normal.   3. Left atrial size was mildly dilated.   4. The mitral valve is normal in structure. No evidence of mitral valve  regurgitation. No evidence of mitral stenosis.   5. The aortic valve has an indeterminant number of cusps. Aortic valve  regurgitation is not visualized. No aortic stenosis is present.   6. The inferior vena cava is normal in size with greater than 50%  respiratory variability, suggesting right atrial pressure of 3 mmHg.   PHYSICAL EXAM:    VS:  BP 114/84 (BP Location: Left Arm, Patient Position: Sitting, Cuff Size: Large)   Pulse 68   Ht 6' 5 (1.956 m)   Wt (!) 322 lb (146.1 kg)   SpO2 98%   BMI 38.18 kg/m   BMI: Body mass index is 38.18 kg/m.  GEN: Well nourished, well developed in no acute distress NECK: No JVD; No carotid bruits CARDIAC: RRR, no murmurs, rubs, gallops RESPIRATORY:  Clear to auscultation without rales, wheezing or rhonchi  ABDOMEN: Soft, non-tender, non-distended EXTREMITIES: No edema; No deformity  Wt Readings from Last 3 Encounters:  06/27/24 (!) 322 lb  (146.1 kg)  04/26/24 (!) 318 lb (144.2 kg)  04/11/24 (!) 320 lb 4 oz (145.3 kg)        ASSESSMENT & PLAN:   Precordial pain Nonobstructive CAD -Coronary CTA 04/2024 showed mild nonobstructive CAD (25 to 49%).  Patient denies any further symptoms of chest pain.  He is continued on  aspirin  81 mg daily.  Recommend starting atorvastatin  40 mg daily.  Palpitations - ZIO monitor 05/2024 showed predominantly sinus rhythm without evidence of arrhythmia.  Patient denies further symptoms.  Hyperlipidemia - Most recent lipid panel 08/2023 with LDL 117.  Patient continues to work on diet and exercise.  Recommend starting atorvastatin  40 mg daily with goal LDL less than 70.    Disposition: F/u with Dr. Darliss or an APP in 6 months.   Medication Adjustments/Labs and Tests Ordered: Current medicines are reviewed at length with the patient today.  Concerns regarding medicines are outlined above. Medication changes, Labs and Tests ordered today are summarized above and listed in the Patient Instructions accessible in Encounters.   Bonney Lesley Maffucci, PA-C 06/27/2024 9:10 AM     [facility] [address] [place], [place] [address] [phone]

## 2024-07-06 DIAGNOSIS — G4733 Obstructive sleep apnea (adult) (pediatric): Secondary | ICD-10-CM | POA: Diagnosis not present

## 2024-07-31 DIAGNOSIS — R438 Other disturbances of smell and taste: Secondary | ICD-10-CM | POA: Diagnosis not present

## 2024-07-31 DIAGNOSIS — R5383 Other fatigue: Secondary | ICD-10-CM | POA: Diagnosis not present

## 2024-08-08 ENCOUNTER — Emergency Department
Admission: EM | Admit: 2024-08-08 | Discharge: 2024-08-08 | Disposition: A | Discharge status: Home / Self Care | Source: Other Acute Inpatient Hospital | Attending: Emergency Medicine | Admitting: Emergency Medicine

## 2024-08-08 ENCOUNTER — Other Ambulatory Visit: Payer: Self-pay

## 2024-08-08 ENCOUNTER — Emergency Department

## 2024-08-08 ENCOUNTER — Encounter: Payer: Self-pay | Admitting: Emergency Medicine

## 2024-08-08 DIAGNOSIS — M4802 Spinal stenosis, cervical region: Secondary | ICD-10-CM | POA: Diagnosis not present

## 2024-08-08 DIAGNOSIS — R29818 Other symptoms and signs involving the nervous system: Secondary | ICD-10-CM | POA: Diagnosis not present

## 2024-08-08 DIAGNOSIS — M4722 Other spondylosis with radiculopathy, cervical region: Secondary | ICD-10-CM | POA: Diagnosis not present

## 2024-08-08 DIAGNOSIS — R202 Paresthesia of skin: Secondary | ICD-10-CM | POA: Diagnosis not present

## 2024-08-08 DIAGNOSIS — M5412 Radiculopathy, cervical region: Secondary | ICD-10-CM | POA: Diagnosis not present

## 2024-08-08 DIAGNOSIS — R200 Anesthesia of skin: Secondary | ICD-10-CM

## 2024-08-08 LAB — DIFFERENTIAL
Abs Immature Granulocytes: 0.03 K/uL (ref 0.00–0.07)
Basophils Absolute: 0 K/uL (ref 0.0–0.1)
Basophils Relative: 1 %
Eosinophils Absolute: 0.2 K/uL (ref 0.0–0.5)
Eosinophils Relative: 3 %
Immature Granulocytes: 1 %
Lymphocytes Relative: 35 %
Lymphs Abs: 2.3 K/uL (ref 0.7–4.0)
Monocytes Absolute: 0.5 K/uL (ref 0.1–1.0)
Monocytes Relative: 8 %
Neutro Abs: 3.5 K/uL (ref 1.7–7.7)
Neutrophils Relative %: 52 %

## 2024-08-08 LAB — COMPREHENSIVE METABOLIC PANEL WITH GFR
ALT: 29 U/L (ref 0–44)
AST: 20 U/L (ref 15–41)
Albumin: 4.3 g/dL (ref 3.5–5.0)
Alkaline Phosphatase: 43 U/L (ref 38–126)
Anion gap: 10 (ref 5–15)
BUN: 13 mg/dL (ref 6–20)
CO2: 24 mmol/L (ref 22–32)
Calcium: 9.2 mg/dL (ref 8.9–10.3)
Chloride: 106 mmol/L (ref 98–111)
Creatinine, Ser: 0.95 mg/dL (ref 0.61–1.24)
GFR, Estimated: >60 mL/min (ref >60)
Glucose, Bld: 89 mg/dL (ref 70–99)
Potassium: 4 mmol/L (ref 3.5–5.1)
Sodium: 140 mmol/L (ref 135–145)
Total Bilirubin: 0.6 mg/dL (ref 0.0–1.2)
Total Protein: 7 g/dL (ref 6.5–8.1)

## 2024-08-08 LAB — CBC
HCT: 46.6 % (ref 39.0–52.0)
Hemoglobin: 15.8 g/dL (ref 13.0–17.0)
MCH: 30.5 pg (ref 26.0–34.0)
MCHC: 33.9 g/dL (ref 30.0–36.0)
MCV: 90 fL (ref 80.0–100.0)
Platelets: 176 K/uL (ref 150–400)
RBC: 5.18 MIL/uL (ref 4.22–5.81)
RDW: 13.1 % (ref 11.5–15.5)
WBC: 6.6 K/uL (ref 4.0–10.5)
nRBC: 0 % (ref 0.0–0.2)

## 2024-08-08 LAB — CBG MONITORING, ED: Glucose-Capillary: 88 mg/dL (ref 70–99)

## 2024-08-08 NOTE — Discharge Instructions (Signed)
 Keep your appointment with a neurologist that has been scheduled. Follow-up with your primary care provider if any continued problems.  Take your medication as prescribed by your doctor.

## 2024-08-08 NOTE — ED Notes (Signed)
 Patient ambulated from unit with a steady gait. No weakness/speech disorder/confusion noted. Color WNL.

## 2024-08-08 NOTE — Progress Notes (Signed)
 See nursing notes.  Refer patient to the ER for evaluation of dizziness, with blurred vision, and bradycardia.  States that at 1 point that his pulse was in the 50s.  He was concerned about cardiovascular pathology.

## 2024-08-08 NOTE — ED Notes (Signed)
 Shona Gin, PA-C in the room.

## 2024-08-08 NOTE — ED Provider Notes (Signed)
 [facility] Provider Note    Event Date/Time   First MD Initiated Contact with Patient 08/08/24 1311     (approximate)   History   Numbness   HPI  Chris Baldwin is a 48 y.o. male presents to the ED via [facility] walk-in clinic for possible stroke symptoms.  Patient reports that he has had intermittent left arm numbness that started more than 2 months ago.  Patient states this was same time that he started on atorvastatin .  Patient states that he stopped taking the atorvastatin  2 days ago but so far has not seen any improvement.  Patient also reports bilateral numbness of his feet intermittently which also has been going on for some time.  Patient yesterday had an episode which he felt drained and had to sit down.  He states that after he sat for a few minutes he felt fine.  He does report that he had some seconds of blurred vision but no change in his speech, balance, ability to ambulate.  Currently he is being sent to a neurologist for evaluation of many of his neurological complaints.     Physical Exam   Triage Vital Signs: ED Triage Vitals  Encounter Vitals Group     BP 08/08/24 1239 127/86     Girls Systolic BP Percentile --      Girls Diastolic BP Percentile --      Boys Systolic BP Percentile --      Boys Diastolic BP Percentile --      Pulse Rate 08/08/24 1239 63     Resp 08/08/24 1239 (!) 97     Temp 08/08/24 1239 97.8 F (36.6 C)     Temp Source 08/08/24 1239 Oral     SpO2 08/08/24 1239 97 %     Weight 08/08/24 1238 (!) 320 lb (145.2 kg)     Height 08/08/24 1238 6' 5 (1.956 m)     Head Circumference --      Peak Flow --      Pain Score 08/08/24 1238 3     Pain Loc --      Pain Education --      Exclude from Growth Chart --     Most recent vital signs: Vitals:   08/08/24 1239 08/08/24 1321  BP: 127/86 134/84  Pulse: 63 62  Resp: 18 16  Temp: 97.8 F (36.6 C)   SpO2: 97% 97%     General: Awake, no distress.  Alert, oriented,  able answer questions appropriately and speaking complete sentences without any difficulty. CV:  Good peripheral perfusion.  Heart rate rate and rhythm. Resp:  Normal effort.  Lungs are clear bilaterally. Abd:  No distention.   Other:  PERRLA, EOMI's, cranial nerves II through XII grossly intact.  Speech is normal.  Good muscle strength bilaterally.  Patient is able to move upper lower extremities without any difficulty.  No point tenderness on palpation of cervical spine or left trapezius muscle.  Skin is intact no discoloration is present.   ED Results / Procedures / Treatments   Labs (all labs ordered are listed, but only abnormal results are displayed) Labs Reviewed  CBC  DIFFERENTIAL  COMPREHENSIVE METABOLIC PANEL WITH GFR  CBG MONITORING, ED     EKG  Vent. rate 69 BPM PR interval 178 ms QRS duration 104 ms QT/QTcB 382/409 ms P-R-T axes 3 24 -4 Normal sinus rhythm Cannot rule out Anterior infarct , age undetermined Abnormal ECG When compared  with ECG of 11-Apr-2024 09:07, (unconfirmed) No significant change was found Unconfirmed   RADIOLOGY CT cervical spine per radiology mild degenerative endplate osteophytes at multiple levels with disc bulges at multiple levels also.  Mild foraminal stenosis on the right at C4-C5. CT head per radiology shows no acute intracranial findings.   PROCEDURES:  Critical Care performed:   Procedures   MEDICATIONS ORDERED IN ED: Medications - No data to display   IMPRESSION / MDM / ASSESSMENT AND PLAN / ED COURSE  I reviewed the triage vital signs and the nursing notes.   Differential diagnosis includes, but is not limited to, CVA, TIA, cerebral mass, cervical strain, cervical degenerative disc disease, cervical disc protrusion causing left arm radiculopathy.  Hypoglycemia, hyperglycemia, hypokalemia, hyponatremia.  48 year old male presents to the ED with complaint of an episode yesterday in which he felt like he dropped but  felt better after he sat down.  He states that he did have some visual blurriness for a couple of seconds which resolved.  Lab work was all reassuring.  Patient does have some cervical issues noted on his CT cervical spine which we discussed.  He does have an appointment with a neurologist and also may in the very near future need to see a neurosurgeon.  CT head was without acute findings.  Patient is to follow-up with his PCP but also to keep his appointment with the neurologist that has already been scheduled.      Patient's presentation is most consistent with acute complicated illness / injury requiring diagnostic workup.  FINAL CLINICAL IMPRESSION(S) / ED DIAGNOSES   Final diagnoses:  Cervical radiculopathy  Numbness and tingling of left upper extremity     Rx / DC Orders   ED Discharge Orders     None        Note:  This document was prepared using Dragon voice recognition software and may include unintentional dictation errors.   Saunders Shona CROME, PA-C 08/08/24 1444    Chris Charleston, MD 08/08/24 [phone]

## 2024-08-08 NOTE — ED Notes (Signed)
 Patient taken to CT scan.

## 2024-08-08 NOTE — ED Triage Notes (Addendum)
 Pt via POV from home. Pt c/o intermittent L arm numbness that started in Aug after he started Atorvastatin . States since then he has been having bilateral numbness feet intermittently. Reports that he stopped the atorvastatin  as per his PCP at the beginning of the week, pt also suppose to take a 81 mg ASA but states he tends to forget. Pt went to the [facility] Walk In and they sent him over here for possible stroke. Pt states he feels fine. Pt is A&OX4 and NAD, ambulatory to triage.

## 2024-08-08 NOTE — ED Notes (Signed)
 Patient is back in the room, watching his phone. No acute distress noted.

## 2024-09-04 ENCOUNTER — Telehealth: Payer: Self-pay | Admitting: Physician Assistant

## 2024-09-04 NOTE — Telephone Encounter (Signed)
 LVM to schedule f/u appt

## 2024-09-18 DIAGNOSIS — E291 Testicular hypofunction: Secondary | ICD-10-CM | POA: Diagnosis not present

## 2024-09-19 NOTE — Progress Notes (Unsigned)
 Cardiology Office Note    Date:  09/20/2024   ID:  Chris Baldwin, DOB 1975/12/21, MRN [id number]  PCP:  Epifanio Alm HERO, MD  Cardiologist:  Redell Cave, MD  Electrophysiologist:  None   Chief Complaint: Follow-up  History of Present Illness:   Chris Baldwin is a 48 y.o. male with history of GERD, fatty liver disease, OSA, morbid obesity, vitamin D  deficiency, insomnia, anxiety, hypotestosteronism, aortic atherosclerosis, nonobstructive CAD, hyperlipidemia, and vitamin B12 deficiency who presents for follow-up on hyperlipidemia.    Patient was seen in the ED 01/2023 with complaints of malaise, fatigue, increased dyspnea on exertion, and chest tightness.  EKG and troponins were normal.  Pain was felt to be noncardiac and patient was discharged home.  In 08/2023, patient developed chest and nasal/sinus congestion associated with mild, focal, left chest and left subscapular discomfort.  This is in the setting of upper respiratory congestion with cough.  He was seen in urgent care and referred to the ED where EKG showed sinus rhythm with poor R wave progression.  Troponin was normal and chest x-ray was unremarkable.  Symptoms were felt to be consistent with cervical radiculopathy versus other musculoskeletal etiology.  He established with our practice 10/2023 and had no recurrence of symptoms.  He did report occasional brief palpitations.  Echo was obtained and demonstrated EF 50 to 55%, no RWMA, and no significant valvular abnormalities.  Coronary calcium  score was obtained 04/2024 and showed a score of 0.  Patient was seen by Dr. Cave 04/11/2024 and reported symptoms of chest pain not associated with exertion and occasional heart flutter.  Coronary CTA was obtained 04/2024 and demonstrated mild nonobstructive CAD and a branch of the first diagonal artery (25 to 49%).  Coronary calcium  score of 1 which was 68th percentile for age and sex matched control.  Long-term monitor  completed 05/2024 showed predominantly sinus rhythm with rare PVCs/PACs and no significant arrhythmia.   Patient was most recently seen by myself 06/27/2024 overall doing well from a cardiac perspective.  He denied further symptoms of chest pain or heart flutter.  He suspected that his prior symptoms were secondary to reflux which he was working to manage with his PCP.  He had been trying to eat better with hopes that this would improve his cholesterol.  It was recommended that he start atorvastatin  40 mg daily with goal LDL less than 70.  However, he called our office with possible statin side effects and was recommended to try statin holiday which improved symptoms and atorvastatin  was discontinued.  Patient presents today overall doing well from a cardiac perspective.  His symptoms including malaise and numbness in his arms resolved after discontinuing atorvastatin . He otherwise has been feeling well. He reports a metallic taste in his mouth with fatigue which he is being following by GI and his PCP for. He also reports some numbness in his feet which his PCP is working up. He would like to discuss alternative options for treatment of his hyperlipidemia. He recently stopped taking magnesium and chest flutter resolved. He denies chest pain, shortness of breath, lightheadedness, dizziness, and lower extremity swelling.   Labs independently reviewed: 09/2024-Hgb 16.5, HCT 47.1, platelet 194 08/2024-sodium 140, potassium 4.0, BUN 13, creatinine 0.95, normal LFTs 08/2023-TC 196, TG 227, HDL 33, LDL 117  Objective   Past Medical History:  Diagnosis Date   Allergy    Anxiety    Aortic atherosclerosis    a. 03/2021 CT Abd/Pelvis - mild  abd Ao Ca2+.   B12 deficiency    Chest pain    Fatty liver disease, nonalcoholic    GERD (gastroesophageal reflux disease)    History of echocardiogram    a. 11/2023 Echo: EF 50-55%, no significant valvular dzs.   Insomnia    Low testosterone  in male    Morbid  obesity (HCC)    Nonobstructive CAD (coronary artery disease)    a. 04/2024 Cardiac CT: Ca2+ = 0; b. 05/2024 Cor CTA: LM <25, LAD 25-49%, LCX small, scattered plaque, RCA <25. Ca2+ = 1.   Palpitations    a. 05/2024 Zio: sinus rhythm.  No significant arrhythmias or pauses.   Sleep apnea    CPAP   Vitamin D  deficiency     Current Medications: Current Meds  Medication Sig   albuterol (VENTOLIN HFA) 108 (90 Base) MCG/ACT inhaler Inhale 2 puffs into the lungs.   ALPRAZolam  (XANAX ) 0.5 MG tablet Take by mouth.   aspirin  EC 81 MG tablet Take 1 tablet (81 mg total) by mouth daily. Swallow whole.   Bempedoic Acid-Ezetimibe (NEXLIZET) 180-10 MG TABS Take 1 tablet by mouth daily.   cyanocobalamin (VITAMIN B12) 1000 MCG tablet Take 1,000 mcg by mouth daily.   magnesium gluconate (MAGONATE) 500 MG tablet Take 500 mg by mouth daily. (Patient taking differently: Take 500 mg by mouth 2 times daily at 12 noon and 4 pm.)   Multiple Vitamins-Minerals (MULTIVITAMIN MEN) TABS Take by mouth daily. Men's one a day   pantoprazole (PROTONIX) 20 MG tablet Take 1 tablet by mouth daily. (Patient taking differently: Take 1 tablet by mouth 2 (two) times daily.)   testosterone  enanthate (DELATESTRYL) 200 MG/ML injection Inject into the muscle every 14 (fourteen) days. For IM use only   VITAMIN D , CHOLECALCIFEROL, PO Take by mouth.   [DISCONTINUED] atorvastatin  (LIPITOR) 40 MG tablet Take 1 tablet (40 mg total) by mouth daily.    Allergies:   Nexium [esomeprazole magnesium], Sulfa antibiotics, and Compazine  [prochlorperazine ]   Social History   Socioeconomic History   Marital status: Married    Spouse name: angela    Number of children: 1   Years of education: Not on file   Highest education level: Associate degree: occupational, scientist, product/process development, or vocational program  Occupational History   Not on file  Tobacco Use   Smoking status: Former    Current packs/day: 0.00    Types: Cigarettes    Start date: 02/20/2012     Quit date: 02/19/2013    Years since quitting: 11.5    Passive exposure: Current   Smokeless tobacco: Never  Vaping Use   Vaping status: Never Used  Substance and Sexual Activity   Alcohol use: Yes    Alcohol/week: 2.0 standard drinks of alcohol    Types: 2 Standard drinks or equivalent per week    Comment: occasional   Drug use: Not Currently   Sexual activity: Yes    Partners: Female  Other Topics Concern   Not on file  Social History Narrative   Lives in [place], married, 48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   Social Drivers of Corporate Investment Banker Strain: Low Risk  (01/18/2024)   Received from [facility]   Overall Financial Resource Strain (CARDIA)    Difficulty of Paying Living Expenses: Not hard at all  Food Insecurity: No Food Insecurity (01/18/2024)   Received from [facility]  Hunger Vital Sign    Within the past 12 months, you worried that your food would run out before you got the money to buy more.: Never true    Within the past 12 months, the food you bought just didn't last and you didn't have money to get more.: Never true  Transportation Needs: No Transportation Needs (01/18/2024)   Received from [facility] - Transportation    In the past 12 months, has lack of transportation kept you from medical appointments or from getting medications?: No    Lack of Transportation (Non-Medical): No  Physical Activity: Sufficiently Active (12/24/2018)   Exercise Vital Sign    Days of Exercise per Week: 7 days    Minutes of Exercise per Session: 150+ min  Stress: Stress Concern Present (12/24/2018)   [employer] of Occupational Health - Occupational Stress Questionnaire    Feeling of Stress : Rather much  Social Connections: Socially Integrated (12/24/2018)   Social Connection and Isolation Panel    Frequency of Communication  with Friends and Family: More than three times a week    Frequency of Social Gatherings with Friends and Family: More than three times a week    Attends Religious Services: More than 4 times per year    Active Member of [employer] or Organizations: Yes    Attends Engineer, Structural: More than 4 times per year    Marital Status: Married     Family History:  The patient's family history includes Alcohol abuse in his maternal uncle; Arthritis in his father and mother; Cancer in his mother; Cancer - Cervical in his father; Diabetes in his mother; Heart disease in his father and mother.  ROS:   12-point review of systems is negative unless otherwise noted in the HPI.  EKGs/Other Studies Reviewed:    Studies reviewed were summarized above. The additional studies were reviewed today:  05/2024 Long term monitor Heart rate range 39-147.  Average heart rate 65-72. No atrial fibrillation or atrial flutter. No significant or sustained arrhythmias.   04/2024 Coronary CTA 1. Mild CAD in a branch of the first diagonal artery, 25-49% stenosis, CADRADS 2. Otherwise, minimal CAD. 2. Total plaque volume 91 mm3 (calcified plaque 0 mm3; non-calcified plaque 91 mm3), which is 56th percentile for age- and sex-matched controls. TPV is mild. 3. Coronary calcium  score is 1, which places the patient in the 68th percentile for age and sex matched control. 4. Normal coronary origins with right dominance.   11/2023 Echo complete 1. Left ventricular ejection fraction, by estimation, is 50 to 55%. Left  ventricular ejection fraction by PLAX is 51 %. The left ventricle has low  normal function. The left ventricle has no regional wall motion  abnormalities. Left ventricular diastolic  parameters were normal. The average left ventricular global longitudinal  strain is -16.4 %.   2. Right ventricular systolic function is normal. The right ventricular  size is normal.   3. Left atrial size was mildly  dilated.   4. The mitral valve is normal in structure. No evidence of mitral valve  regurgitation. No evidence of mitral stenosis.   5. The aortic valve has an indeterminant number of cusps. Aortic valve  regurgitation is not visualized. No aortic stenosis is present.   6. The inferior vena cava is normal in size with greater than 50%  respiratory variability, suggesting right atrial pressure of 3 mmHg.    PHYSICAL EXAM:    VS:  BP  115/62 (BP Location: Left Arm, Patient Position: Sitting, Cuff Size: Normal)   Pulse 61   Ht 6' 4 (1.93 m)   Wt (!) 320 lb 9.6 oz (145.4 kg)   SpO2 96%   BMI 39.02 kg/m   BMI: Body mass index is 39.02 kg/m.  GEN: Well nourished, well developed in no acute distress NECK: No JVD; No carotid bruits CARDIAC: RRR, no murmurs, rubs, gallops RESPIRATORY:  Clear to auscultation without rales, wheezing or rhonchi  ABDOMEN: Soft, non-tender, non-distended EXTREMITIES: No edema; No deformity  Wt Readings from Last 3 Encounters:  09/20/24 (!) 320 lb 9.6 oz (145.4 kg)  08/08/24 (!) 320 lb (145.2 kg)  06/27/24 (!) 322 lb (146.1 kg)                  ASSESSMENT & PLAN:   Hyperlipidemia - Most recent lipid panel 08/2023 with LDL 117, goal less than 70.  He continues to work on diet and exercise.  Recommend Nexlizet 180/10 mg daily.  Precordial pain Nonobstructive CAD - Coronary CTA 04/2024 showed mild nonobstructive CAD. He denies chest pain today. He did not tolerate atorvastatin  due to malaise and numbness. Recommend starting Nexlizet as above. He is continued on ASA 81 mg daily.   Palpitations - ZIO monitor 05/2024 showed predominantly sinus rhythm without evidence of arrhythmia.  Patient denies further symptoms.    Disposition: Start bempedoic acid/ezetimibe 180/10 mg daily.  Recheck lipid panel in 6 to 8 weeks.  F/u with Dr. Darliss or an APP in 6 months.   Medication Adjustments/Labs and Tests Ordered: Current medicines are reviewed at  length with the patient today.  Concerns regarding medicines are outlined above. Medication changes, Labs and Tests ordered today are summarized above and listed in the Patient Instructions accessible in Encounters.   Bonney Lesley Maffucci, PA-C 09/20/2024 9:06 AM     [facility] [place] [address] [place], [place] [address] [phone]

## 2024-09-20 ENCOUNTER — Ambulatory Visit: Attending: Physician Assistant | Admitting: Physician Assistant

## 2024-09-20 ENCOUNTER — Encounter: Payer: Self-pay | Admitting: Physician Assistant

## 2024-09-20 VITALS — BP 115/62 | HR 61 | Ht 76.0 in | Wt 320.6 lb

## 2024-09-20 DIAGNOSIS — E785 Hyperlipidemia, unspecified: Secondary | ICD-10-CM

## 2024-09-20 DIAGNOSIS — I2510 Atherosclerotic heart disease of native coronary artery without angina pectoris: Secondary | ICD-10-CM | POA: Diagnosis not present

## 2024-09-20 DIAGNOSIS — R072 Precordial pain: Secondary | ICD-10-CM | POA: Diagnosis not present

## 2024-09-20 DIAGNOSIS — R002 Palpitations: Secondary | ICD-10-CM | POA: Diagnosis not present

## 2024-09-20 MED ORDER — NEXLIZET 180-10 MG PO TABS: 1.0000 | ORAL_TABLET | Freq: Every day | ORAL | 3 refills | Status: AC

## 2024-09-20 NOTE — Patient Instructions (Addendum)
 Medication Instructions:  Your physician recommends the following medication changes.   START TAKING:   NEXLIZET 180/10 DAILY   STOP TAKING :  ATORVASTATIN    *If you need a refill on your cardiac medications before your next appointment, please call your pharmacy*  Lab Work: Your provider would like for you to return in 2 MONTHS  to have the following labs drawn: LIPID PANEL.   Please go to [facility] [address] ([facility] [address], [place] [address] You do not need an appointment.  They are open from 8 am- 4:30 pm.  Lunch from 1:00 pm- 2:00 pm You DO need to be fasting.  [address] [place],  [place]  [address]  [place]  Phone: [phone] Lab hours: Mon-Fri 8 am- 5 pm    Lunch 12 pm- 1 pm  If you have labs (blood work) drawn today and your tests are completely normal, you will receive your results only by: MyChart Message (if you have MyChart) OR A paper copy in the mail If you have any lab test that is abnormal or we need to change your treatment, we will call you to review the results.  Testing/Procedures: No test ordered today   Follow-Up: At [facility], you and your health needs are our priority.  As part of our continuing mission to provide you with exceptional heart care, our providers are all part of one team.  This team includes your primary Cardiologist (physician) and Advanced Practice Providers or APPs (Physician Assistants and Nurse Practitioners) who all work together to provide you with the care you need, when you need it.  Your next appointment:   6 month(s)  Provider:   You will see one of the following Advanced Practice Providers on your designated Care Team:   Lonni Meager, NP Lesley Maffucci, PA-C Bernardino Bring, PA-C Cadence [place], PA-C Tylene Lunch, NP Barnie Hila, NP

## 2024-09-27 DIAGNOSIS — E669 Obesity, unspecified: Secondary | ICD-10-CM | POA: Diagnosis not present

## 2024-09-27 DIAGNOSIS — R5383 Other fatigue: Secondary | ICD-10-CM | POA: Diagnosis not present

## 2024-09-27 DIAGNOSIS — Z125 Encounter for screening for malignant neoplasm of prostate: Secondary | ICD-10-CM | POA: Diagnosis not present

## 2024-09-27 DIAGNOSIS — Z1331 Encounter for screening for depression: Secondary | ICD-10-CM | POA: Diagnosis not present

## 2024-09-27 DIAGNOSIS — R739 Hyperglycemia, unspecified: Secondary | ICD-10-CM | POA: Diagnosis not present

## 2024-09-27 DIAGNOSIS — Z0000 Encounter for general adult medical examination without abnormal findings: Secondary | ICD-10-CM | POA: Diagnosis not present

## 2024-09-27 DIAGNOSIS — Z13220 Encounter for screening for lipoid disorders: Secondary | ICD-10-CM | POA: Diagnosis not present

## 2024-10-01 DIAGNOSIS — N529 Male erectile dysfunction, unspecified: Secondary | ICD-10-CM | POA: Diagnosis not present

## 2024-10-01 DIAGNOSIS — Z91014 Allergy to mammalian meats: Secondary | ICD-10-CM | POA: Diagnosis not present

## 2024-10-01 DIAGNOSIS — E291 Testicular hypofunction: Secondary | ICD-10-CM | POA: Diagnosis not present

## 2024-11-29 ENCOUNTER — Ambulatory Visit
Admission: EM | Admit: 2024-11-29 | Discharge: 2024-11-29 | Disposition: A | Discharge status: Home / Self Care | Attending: Family Medicine | Admitting: Family Medicine

## 2024-11-29 ENCOUNTER — Encounter: Payer: Self-pay | Admitting: Emergency Medicine

## 2024-11-29 DIAGNOSIS — Z87891 Personal history of nicotine dependence: Secondary | ICD-10-CM | POA: Diagnosis not present

## 2024-11-29 DIAGNOSIS — R030 Elevated blood-pressure reading, without diagnosis of hypertension: Secondary | ICD-10-CM

## 2024-11-29 DIAGNOSIS — J22 Unspecified acute lower respiratory infection: Secondary | ICD-10-CM

## 2024-11-29 DIAGNOSIS — J069 Acute upper respiratory infection, unspecified: Secondary | ICD-10-CM

## 2024-11-29 LAB — POC COVID19/FLU A&B COMBO
Covid Antigen, POC: NEGATIVE
Influenza A Antigen, POC: NEGATIVE
Influenza B Antigen, POC: NEGATIVE

## 2024-11-29 MED ORDER — PREDNISONE 10 MG (21) PO TBPK: ORAL_TABLET | Freq: Every day | ORAL | 0 refills | Status: AC

## 2024-11-29 NOTE — ED Triage Notes (Signed)
 Patient c/o cough, chest congestion, chills, fever, and nasal congestion that started on Wed.

## 2024-11-29 NOTE — Discharge Instructions (Signed)
 Your influenza and COVID are negative. You most likely have a viral respiratory infection that will gradually improve over the next 7-10 days. Cough may last up to 3 weeks. If your were prescribed medication, stop by the pharmacy to pick them up.  You can take Tylenol  and/or Ibuprofen  as needed for fever reduction and pain relief.    For cough: honey 1/2 to 1 teaspoon (you can dilute the honey in water or another fluid).  You can also use guaifenesin and dextromethorphan for cough.  You can use a humidifier for chest congestion and cough.  If you don't have a humidifier, you can sit in the bathroom with the hot shower running.      For sore throat: try warm salt water gargles, Mucinex sore throat cough drops or cepacol lozenges, throat spray, warm tea or water with lemon/honey, popsicles or ice, or OTC cold relief medicine for throat discomfort. You can also purchase chloraseptic spray at the pharmacy or dollar store.     For congestion: take a daily anti-histamine like Zyrtec, Claritin, and a oral decongestant, such as pseudoephedrine.  You can also use Flonase  1-2 sprays in each nostril daily. Afrin is also a good option, if you do not have high blood pressure.    It is important to stay hydrated: drink plenty of fluids (water, gatorade/powerade/pedialyte, juices, or teas) to keep your throat moisturized and help further relieve irritation/discomfort.    Return or go to the Emergency Department if symptoms worsen or do not improve in the next few days
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
